# Patient Record
Sex: Male | Born: 1980 | Race: White | Hispanic: No | Marital: Married | Smoking: Current every day smoker
Health system: Southern US, Community
[De-identification: ages and names within clinical notes are randomized; demographics above are authoritative.]

## PROBLEM LIST (undated history)

## (undated) HISTORY — PX: HAND SURGERY: SHX662

---

## 2002-01-06 ENCOUNTER — Emergency Department (HOSPITAL_COMMUNITY): Admission: EM | Admit: 2002-01-06 | Discharge: 2002-01-06 | Payer: Self-pay | Admitting: Emergency Medicine

## 2002-03-02 ENCOUNTER — Ambulatory Visit (HOSPITAL_COMMUNITY): Admission: RE | Admit: 2002-03-02 | Discharge: 2002-03-02 | Payer: Self-pay | Admitting: *Deleted

## 2002-03-02 ENCOUNTER — Encounter: Payer: Self-pay | Admitting: *Deleted

## 2002-04-25 ENCOUNTER — Emergency Department (HOSPITAL_COMMUNITY): Admission: EM | Admit: 2002-04-25 | Discharge: 2002-04-25 | Payer: Self-pay | Admitting: *Deleted

## 2002-07-10 ENCOUNTER — Emergency Department (HOSPITAL_COMMUNITY): Admission: AC | Admit: 2002-07-10 | Discharge: 2002-07-11 | Payer: Self-pay

## 2002-07-10 ENCOUNTER — Encounter: Payer: Self-pay | Admitting: Emergency Medicine

## 2003-06-28 ENCOUNTER — Ambulatory Visit (HOSPITAL_COMMUNITY): Admission: RE | Admit: 2003-06-28 | Discharge: 2003-06-28 | Payer: Self-pay | Admitting: Family Medicine

## 2003-07-18 ENCOUNTER — Emergency Department (HOSPITAL_COMMUNITY): Admission: EM | Admit: 2003-07-18 | Discharge: 2003-07-18 | Payer: Self-pay | Admitting: Emergency Medicine

## 2004-07-11 ENCOUNTER — Emergency Department (HOSPITAL_COMMUNITY): Admission: EM | Admit: 2004-07-11 | Discharge: 2004-07-12 | Payer: Self-pay | Admitting: Emergency Medicine

## 2004-11-08 ENCOUNTER — Emergency Department (HOSPITAL_COMMUNITY): Admission: EM | Admit: 2004-11-08 | Discharge: 2004-11-08 | Payer: Self-pay | Admitting: Emergency Medicine

## 2005-12-27 ENCOUNTER — Emergency Department (HOSPITAL_COMMUNITY): Admission: EM | Admit: 2005-12-27 | Discharge: 2005-12-27 | Payer: Self-pay | Admitting: Emergency Medicine

## 2006-01-23 ENCOUNTER — Emergency Department (HOSPITAL_COMMUNITY): Admission: EM | Admit: 2006-01-23 | Discharge: 2006-01-23 | Payer: Self-pay | Admitting: Emergency Medicine

## 2006-05-14 ENCOUNTER — Emergency Department (HOSPITAL_COMMUNITY): Admission: EM | Admit: 2006-05-14 | Discharge: 2006-05-14 | Payer: Self-pay | Admitting: Emergency Medicine

## 2006-05-15 ENCOUNTER — Emergency Department (HOSPITAL_COMMUNITY): Admission: EM | Admit: 2006-05-15 | Discharge: 2006-05-15 | Payer: Self-pay | Admitting: Emergency Medicine

## 2006-08-05 ENCOUNTER — Emergency Department (HOSPITAL_COMMUNITY): Admission: EM | Admit: 2006-08-05 | Discharge: 2006-08-05 | Payer: Self-pay | Admitting: Emergency Medicine

## 2006-08-26 ENCOUNTER — Emergency Department (HOSPITAL_COMMUNITY): Admission: EM | Admit: 2006-08-26 | Discharge: 2006-08-26 | Payer: Self-pay | Admitting: Emergency Medicine

## 2007-04-13 ENCOUNTER — Emergency Department (HOSPITAL_COMMUNITY): Admission: EM | Admit: 2007-04-13 | Discharge: 2007-04-13 | Payer: Self-pay | Admitting: Emergency Medicine

## 2008-08-24 ENCOUNTER — Emergency Department (HOSPITAL_COMMUNITY): Admission: EM | Admit: 2008-08-24 | Discharge: 2008-08-24 | Payer: Self-pay | Admitting: Emergency Medicine

## 2008-09-16 ENCOUNTER — Emergency Department (HOSPITAL_COMMUNITY): Admission: EM | Admit: 2008-09-16 | Discharge: 2008-09-16 | Payer: Self-pay | Admitting: Emergency Medicine

## 2010-04-20 LAB — COMPREHENSIVE METABOLIC PANEL
ALT: 42 U/L (ref 0–53)
AST: 32 U/L (ref 0–37)
Albumin: 4 g/dL (ref 3.5–5.2)
Alkaline Phosphatase: 51 U/L (ref 39–117)
BUN: 7 mg/dL (ref 6–23)
CO2: 28 mEq/L (ref 19–32)
Calcium: 8.8 mg/dL (ref 8.4–10.5)
Chloride: 101 mEq/L (ref 96–112)
Creatinine, Ser: 0.94 mg/dL (ref 0.4–1.5)
GFR calc Af Amer: >60 mL/min (ref >60)
GFR calc non Af Amer: >60 mL/min (ref >60)
Glucose, Bld: 90 mg/dL (ref 70–99)
Potassium: 3.8 mEq/L (ref 3.5–5.1)
Sodium: 136 mEq/L (ref 135–145)
Total Bilirubin: 1 mg/dL (ref 0.3–1.2)
Total Protein: 7.3 g/dL (ref 6.0–8.3)

## 2010-04-20 LAB — DIFFERENTIAL
Basophils Absolute: 0 10*3/uL (ref 0.0–0.1)
Basophils Relative: 0 % (ref 0–1)
Eosinophils Absolute: 0.1 10*3/uL (ref 0.0–0.7)
Eosinophils Relative: 1 % (ref 0–5)
Lymphocytes Relative: 18 % (ref 12–46)
Lymphs Abs: 1.3 10*3/uL (ref 0.7–4.0)
Monocytes Absolute: 0.4 10*3/uL (ref 0.1–1.0)
Monocytes Relative: 6 % (ref 3–12)
Neutro Abs: 5.3 10*3/uL (ref 1.7–7.7)
Neutrophils Relative %: 75 % (ref 43–77)

## 2010-04-20 LAB — CBC
HCT: 43.1 % (ref 39.0–52.0)
Hemoglobin: 15.2 g/dL (ref 13.0–17.0)
MCHC: 35.2 g/dL (ref 30.0–36.0)
MCV: 94.5 fL (ref 78.0–100.0)
Platelets: 189 10*3/uL (ref 150–400)
RBC: 4.56 MIL/uL (ref 4.22–5.81)
RDW: 12.6 % (ref 11.5–15.5)
WBC: 7.1 10*3/uL (ref 4.0–10.5)

## 2010-06-01 NOTE — Procedures (Signed)
   Andrew Mueller, Andrew Mueller                           ACCOUNT NO.:  [id number]   MEDICAL RECORD NO.:  [id number]                   PATIENT TYPE:  EMS   LOCATION:  ED                                   FACILITY:  [facility]   PHYSICIAN:  Edward L. Juanetta Gosling, M.D.             DATE OF BIRTH:  02/24/1980   DATE OF PROCEDURE:  04/25/2002  DATE OF DISCHARGE:  04/25/2002                                EKG INTERPRETATION   DATE AND TIME OF TEST:  April 25, 2002 at 0044.   FINDINGS:  The rhythm is sinus rhythm with a tachycardic rate in the 100s.  This is some evidence of left ventricular hypertrophy.  Small Q waves are  seen inferiorly.  There are ST-T wave changes which are diffuse and  nonspecific.   IMPRESSION:  Abnormal electrocardiogram.                                               Edward L. Juanetta Gosling, M.D.    ELH/MEDQ  D:  04/26/2002  T:  04/26/2002  Job:  [id number]

## 2010-10-25 ENCOUNTER — Emergency Department (HOSPITAL_COMMUNITY): Payer: Medicaid Other

## 2010-10-25 ENCOUNTER — Emergency Department (HOSPITAL_COMMUNITY)
Admission: EM | Admit: 2010-10-25 | Discharge: 2010-10-25 | Disposition: A | Discharge status: Home / Self Care | Payer: Medicaid Other | Attending: Emergency Medicine | Admitting: Emergency Medicine

## 2010-10-25 ENCOUNTER — Encounter: Payer: Self-pay | Admitting: Emergency Medicine

## 2010-10-25 DIAGNOSIS — X58XXXA Exposure to other specified factors, initial encounter: Secondary | ICD-10-CM | POA: Insufficient documentation

## 2010-10-25 DIAGNOSIS — S239XXA Sprain of unspecified parts of thorax, initial encounter: Secondary | ICD-10-CM | POA: Insufficient documentation

## 2010-10-25 DIAGNOSIS — IMO0002 Reserved for concepts with insufficient information to code with codable children: Secondary | ICD-10-CM

## 2010-10-25 MED ORDER — PREDNISOLONE 5 MG PO TABS: 60.0000 mg | ORAL_TABLET | Freq: Once | ORAL | Status: DC

## 2010-10-25 MED ORDER — PREDNISONE 20 MG PO TABS
60.0000 mg | ORAL_TABLET | Freq: Once | ORAL | Status: AC
  Administered 2010-10-25: 60 mg via ORAL
  Filled 2010-10-25: qty 3

## 2010-10-25 MED ORDER — PREDNISONE 10 MG PO TABS: 20.0000 mg | ORAL_TABLET | Freq: Every day | ORAL | Status: AC

## 2010-10-25 MED ORDER — OXYCODONE-ACETAMINOPHEN 5-325 MG PO TABS
2.0000 | ORAL_TABLET | Freq: Once | ORAL | Status: AC
  Administered 2010-10-25: 2 via ORAL
  Filled 2010-10-25: qty 2

## 2010-10-25 MED ORDER — DIAZEPAM 5 MG/ML IJ SOLN
5.0000 mg | Freq: Once | INTRAMUSCULAR | Status: AC
  Administered 2010-10-25: 5 mg via INTRAVENOUS
  Filled 2010-10-25: qty 2

## 2010-10-25 MED ORDER — OXYCODONE-ACETAMINOPHEN 7.5-325 MG PO TABS: 1.0000 | ORAL_TABLET | ORAL | Status: AC | PRN

## 2010-10-25 MED ORDER — DIAZEPAM 5 MG PO TABS: 5.0000 mg | ORAL_TABLET | Freq: Two times a day (BID) | ORAL | Status: AC

## 2010-10-25 NOTE — ED Provider Notes (Signed)
History     CSN: [id number] Arrival date & time: 10/25/2010 11:47 AM  Chief Complaint  Patient presents with  . Back Pain    [name] is a 30 y.o. male who presents to the ED for back pain that started suddenly yesterday when he tried to stop a log from rolling off a truck. He was helping someone clear off land and the truck was loaded with logs. The pain radiates from mid back to left leg. The pain is severe. The patient took tylenol extra strength last night without relief. He has difficulty walking due to the pain. He denies any loss of bladder or bowel control.   History reviewed. No pertinent past medical history.  Past Surgical History  Procedure Date  . Hand surgery     No family history on file.  History  Substance Use Topics  . Smoking status: Not on file  . Smokeless tobacco: Not on file  . Alcohol Use: No      Review of Systems  Constitutional: Positive for activity change. Negative for fever and fatigue.  HENT: Negative.   Eyes: Negative.   Respiratory: Negative.   Cardiovascular: Negative.   Gastrointestinal: Negative for nausea, vomiting, abdominal pain, diarrhea and constipation.  Genitourinary: Negative for dysuria, urgency and difficulty urinating.  Musculoskeletal: Positive for back pain and gait problem.  Neurological: Positive for numbness. Negative for syncope and headaches.  Psychiatric/Behavioral: Negative for behavioral problems and confusion.    Allergies  Review of patient's allergies indicates no known allergies.  Home Medications  No current outpatient prescriptions on file.  BP 143/103  Pulse 89  Temp(Src) 98.7 F (37.1 C) (Oral)  Resp 14  Ht 6\' 3"  (1.905 m)  Wt 245 lb (111.131 kg)  BMI 30.62 kg/m2  SpO2 97%  Physical Exam  Nursing note and vitals reviewed. Constitutional: He is oriented to person, place, and time. He appears well-developed and well-nourished. No distress.       Uncomfortable appearing.  HENT:    Head: Normocephalic and atraumatic.  Eyes: EOM are normal. Pupils are equal, round, and reactive to light.  Neck: Normal range of motion. Neck supple.  Cardiovascular: Normal rate and regular rhythm.   Pulmonary/Chest: Effort normal.  Abdominal: Soft. There is no tenderness.  Musculoskeletal: Normal range of motion. He exhibits no edema.       Pedal pulses present and lower extremities without neurovascular compromise.   Neurological: He is alert and oriented to person, place, and time. He has normal reflexes. No cranial nerve deficit.  Skin: Skin is warm and dry.  Psychiatric: He has a normal mood and affect. His behavior is normal. Judgment and thought content normal.   Dg Thoracic Spine W/swimmers  10/25/2010  *RADIOLOGY REPORT*  Clinical Data: Mid back pain  THORACIC SPINE - 2 VIEW + SWIMMERS  Comparison: 09/16/2008  Findings: Vertebral body height is well maintained.  Very minimal osteophytic changes are seen.  The pedicles are within normal limits and no paraspinal mass lesion is noted.  The visualized rib cage is unremarkable.  Visualized lung fields are also unremarkable.  Mild degenerative changes noted at C5-6.  IMPRESSION: Mild degenerative change without acute abnormality.  Original Report Authenticated By: Phillips Odor, M.D.   Assessment: Severe back strain  Plan:  Prednisone 40 mg. Po daily x 5 days   Percocet 5/325 mg. Po q 6 hours   Valium 5 mg.   Follow [facility] with [facility]   ED Course  Procedures  MDM          Kerrie Buffalo, NP 10/25/10 [address], NP 10/26/10 [phone]

## 2010-10-25 NOTE — ED Notes (Signed)
Pt tried to stop a log from rolling off truck. Strained mid to lower back. Pt states pain going down left leg as well.

## 2010-10-25 NOTE — ED Notes (Signed)
Pt states attempted to stop rolling log (16" diameter x 18 ft long) from falling off truck. Pt reports feeling pain in back immediately.  Pt states has a lower herniated disc x 3 yrs ago, states has had no issues with back . Pt states pain radiates to lt leg when standing.

## 2010-11-02 NOTE — ED Provider Notes (Signed)
Evaluation and management procedures were performed by the PA/NP under my supervision/collaboration.    Felisa Bonier, MD 11/02/10 [phone]

## 2011-02-19 ENCOUNTER — Encounter (HOSPITAL_COMMUNITY): Payer: Self-pay | Admitting: *Deleted

## 2011-02-19 ENCOUNTER — Emergency Department (HOSPITAL_COMMUNITY)
Admission: EM | Admit: 2011-02-19 | Discharge: 2011-02-19 | Disposition: A | Discharge status: Home / Self Care | Payer: Self-pay | Attending: Emergency Medicine | Admitting: Emergency Medicine

## 2011-02-19 DIAGNOSIS — F17200 Nicotine dependence, unspecified, uncomplicated: Secondary | ICD-10-CM | POA: Insufficient documentation

## 2011-02-19 DIAGNOSIS — K029 Dental caries, unspecified: Secondary | ICD-10-CM | POA: Insufficient documentation

## 2011-02-19 DIAGNOSIS — R509 Fever, unspecified: Secondary | ICD-10-CM | POA: Insufficient documentation

## 2011-02-19 DIAGNOSIS — K089 Disorder of teeth and supporting structures, unspecified: Secondary | ICD-10-CM | POA: Insufficient documentation

## 2011-02-19 DIAGNOSIS — K0889 Other specified disorders of teeth and supporting structures: Secondary | ICD-10-CM

## 2011-02-19 MED ORDER — HYDROCODONE-ACETAMINOPHEN 5-325 MG PO TABS: ORAL_TABLET | ORAL | Status: DC

## 2011-02-19 MED ORDER — IBUPROFEN 800 MG PO TABS
800.0000 mg | ORAL_TABLET | Freq: Once | ORAL | Status: AC
  Administered 2011-02-19: 800 mg via ORAL
  Filled 2011-02-19: qty 1

## 2011-02-19 MED ORDER — PENICILLIN V POTASSIUM 500 MG PO TABS: 500.0000 mg | ORAL_TABLET | Freq: Four times a day (QID) | ORAL | Status: AC

## 2011-02-19 MED ORDER — HYDROCODONE-ACETAMINOPHEN 5-325 MG PO TABS
1.0000 | ORAL_TABLET | Freq: Once | ORAL | Status: AC
  Administered 2011-02-19: 1 via ORAL
  Filled 2011-02-19: qty 1

## 2011-02-19 MED ORDER — PENICILLIN V POTASSIUM 250 MG PO TABS
500.0000 mg | ORAL_TABLET | Freq: Once | ORAL | Status: AC
  Administered 2011-02-19: 500 mg via ORAL
  Filled 2011-02-19: qty 2

## 2011-02-19 NOTE — ED Notes (Signed)
Rick PA in prior to RN, see PA assessment for further 

## 2011-02-19 NOTE — ED Provider Notes (Signed)
History     CSN: [id number]  Arrival date & time 02/19/11  2001   First MD Initiated Contact with Patient 02/19/11 2031      Chief Complaint  Patient presents with  . Dental Pain    (Consider location/radiation/quality/duration/timing/severity/associated sxs/prior treatment) Patient is a 31 y.o. male presenting with tooth pain. The history is provided by the patient.  Dental PainThe primary symptoms include mouth pain and fever. Primary symptoms do not include dental injury. The symptoms began 2 days ago. The symptoms are unchanged. The symptoms occur constantly.    History reviewed. No pertinent past medical history.  Past Surgical History  Procedure Date  . Hand surgery     No family history on file.  History  Substance Use Topics  . Smoking status: Current Everyday Smoker -- 1.0 packs/day  . Smokeless tobacco: Not on file  . Alcohol Use: No      Review of Systems  Constitutional: Positive for fever.  HENT: Positive for dental problem.   All other systems reviewed and are negative.    Allergies  Review of patient's allergies indicates no known allergies.  Home Medications  No current outpatient prescriptions on file.  BP 127/85  Pulse 94  Temp(Src) 99.4 F (37.4 C) (Oral)  Resp 20  Ht 6\' 3"  (1.905 m)  Wt 245 lb (111.131 kg)  BMI 30.62 kg/m2  SpO2 98%  Physical Exam  Nursing note and vitals reviewed. Constitutional: He is oriented to person, place, and time. He appears well-developed and well-nourished.  HENT:  Head: Normocephalic and atraumatic.  Mouth/Throat: Uvula is midline. Dental caries present. No uvula swelling.         L upper lat incisor and bicuspid decayed to gumline.  Eyes: EOM are normal.  Neck: Normal range of motion.  Cardiovascular: Normal rate, regular rhythm, normal heart sounds and intact distal pulses.   Pulmonary/Chest: Effort normal and breath sounds normal. No respiratory distress.  Abdominal: Soft. He exhibits no  distension. There is no tenderness.  Musculoskeletal: Normal range of motion.  Neurological: He is alert and oriented to person, place, and time.  Skin: Skin is warm and dry.  Psychiatric: He has a normal mood and affect. Judgment normal.    ED Course  Procedures (including critical care time)  Labs Reviewed - No data to display No results found.   No diagnosis found.    MDM          Worthy Rancher, PA 02/19/11 2207

## 2011-02-19 NOTE — ED Provider Notes (Signed)
Medical screening examination/treatment/procedure(s) were performed by non-physician practitioner and as supervising physician I was immediately available for consultation/collaboration.   Maeryn Mcgath L Jonaven Hilgers, MD 02/19/11 2239 

## 2011-02-19 NOTE — ED Notes (Signed)
Toothache since Sunday 

## 2011-04-14 ENCOUNTER — Encounter (HOSPITAL_COMMUNITY): Payer: Self-pay | Admitting: *Deleted

## 2011-04-14 ENCOUNTER — Emergency Department (HOSPITAL_COMMUNITY)
Admission: EM | Admit: 2011-04-14 | Discharge: 2011-04-14 | Disposition: A | Discharge status: Home / Self Care | Payer: Self-pay | Attending: Emergency Medicine | Admitting: Emergency Medicine

## 2011-04-14 DIAGNOSIS — K0889 Other specified disorders of teeth and supporting structures: Secondary | ICD-10-CM

## 2011-04-14 DIAGNOSIS — F17200 Nicotine dependence, unspecified, uncomplicated: Secondary | ICD-10-CM | POA: Insufficient documentation

## 2011-04-14 DIAGNOSIS — K089 Disorder of teeth and supporting structures, unspecified: Secondary | ICD-10-CM | POA: Insufficient documentation

## 2011-04-14 DIAGNOSIS — K029 Dental caries, unspecified: Secondary | ICD-10-CM | POA: Insufficient documentation

## 2011-04-14 MED ORDER — PENICILLIN V POTASSIUM 250 MG PO TABS
ORAL_TABLET | ORAL | Status: AC
  Filled 2011-04-14: qty 2

## 2011-04-14 MED ORDER — OXYCODONE-ACETAMINOPHEN 5-325 MG PO TABS
ORAL_TABLET | ORAL | Status: AC
  Filled 2011-04-14: qty 1

## 2011-04-14 MED ORDER — HYDROCODONE-ACETAMINOPHEN 5-325 MG PO TABS: ORAL_TABLET | ORAL | Status: AC

## 2011-04-14 MED ORDER — OXYCODONE-ACETAMINOPHEN 5-325 MG PO TABS
1.0000 | ORAL_TABLET | Freq: Once | ORAL | Status: AC
  Administered 2011-04-14: 1 via ORAL

## 2011-04-14 MED ORDER — PENICILLIN V POTASSIUM 250 MG PO TABS
500.0000 mg | ORAL_TABLET | Freq: Once | ORAL | Status: AC
  Administered 2011-04-14: 500 mg via ORAL

## 2011-04-14 MED ORDER — PENICILLIN V POTASSIUM 500 MG PO TABS: 500.0000 mg | ORAL_TABLET | Freq: Four times a day (QID) | ORAL | Status: AC

## 2011-04-14 NOTE — ED Notes (Signed)
Pt stated that his wife is out there to drive pt back home

## 2011-04-14 NOTE — ED Notes (Signed)
C/o left lower pain to tooth that has "abscessed" per pt, denies seeing a dentist for it

## 2011-04-14 NOTE — ED Notes (Signed)
Left sided toothache x 4 days.  States had some "left over" PCN and took 4 pills yesterday and 2 pills today and has ran out.

## 2011-04-14 NOTE — ED Provider Notes (Signed)
History     CSN: [id number]  Arrival date & time 04/14/11  1758   First MD Initiated Contact with Patient 04/14/11 1915      Chief Complaint  Patient presents with  . Dental Pain    (Consider location/radiation/quality/duration/timing/severity/associated sxs/prior treatment) Patient is a 31 y.o. male presenting with tooth pain. The history is provided by the patient. No language interpreter was used.  Dental PainThe primary symptoms include mouth pain. Primary symptoms do not include dental injury, oral bleeding, oral lesions, headaches, fever, sore throat or angioedema. The symptoms began 3 to 5 days ago. The symptoms are unchanged. The symptoms are new. The symptoms occur constantly.  Mouth pain began 3 - 5 days ago. Mouth pain occurs constantly. Mouth pain is unchanged. Affected locations include: teeth and gum(s).  Additional symptoms include: dental sensitivity to temperature and gum tenderness. Additional symptoms do not include: gum swelling, purulent gums, trismus, jaw pain, facial swelling, trouble swallowing and drooling. Medical issues include: smoking and periodontal disease.    History reviewed. No pertinent past medical history.  Past Surgical History  Procedure Date  . Hand surgery     History reviewed. No pertinent family history.  History  Substance Use Topics  . Smoking status: Current Everyday Smoker -- 1.0 packs/day  . Smokeless tobacco: Not on file  . Alcohol Use: No      Review of Systems  Constitutional: Negative for fever and chills.  HENT: Positive for dental problem. Negative for sore throat, facial swelling, drooling, trouble swallowing, neck pain and neck stiffness.   Gastrointestinal: Negative for vomiting.  Skin: Negative for rash.  Neurological: Negative for dizziness, weakness, numbness and headaches.  Hematological: Negative for adenopathy. Does not bruise/bleed easily.  All other systems reviewed and are negative.    Allergies    Review of patient's allergies indicates no known allergies.  Home Medications   Current Outpatient Rx  Name Route Sig Dispense Refill  . HYDROCODONE-ACETAMINOPHEN 5-325 MG PO TABS  One tab po q 4-6 hrs prn pain 20 tablet 0    BP 142/87  Pulse 67  Temp(Src) 98.1 F (36.7 C) (Oral)  Resp 20  Ht 6\' 3"  (1.905 m)  Wt 245 lb (111.131 kg)  BMI 30.62 kg/m2  SpO2 100%  Physical Exam  Nursing note and vitals reviewed. Constitutional: He is oriented to person, place, and time. He appears well-developed and well-nourished. No distress.  HENT:  Head: Normocephalic and atraumatic. No trismus in the jaw.  Right Ear: Tympanic membrane and ear canal normal.  Left Ear: Tympanic membrane and ear canal normal.  Mouth/Throat: Uvula is midline, oropharynx is clear and moist and mucous membranes are normal. No oral lesions. Dental caries present. No dental abscesses or uvula swelling.    Neck: Normal range of motion. Neck supple.  Cardiovascular: Normal rate, regular rhythm and normal heart sounds.   Pulmonary/Chest: Effort normal and breath sounds normal. No respiratory distress.  Abdominal: Soft. He exhibits no distension. There is no tenderness.  Lymphadenopathy:    He has no cervical adenopathy.  Neurological: He is alert and oriented to person, place, and time. He exhibits normal muscle tone. Coordination normal.  Skin: Skin is warm and dry.    ED Course  Procedures (including critical care time)       MDM    Patient has multiple dental caries. No obvious dental abscess at this time. No facial edema no trismus. Patient has been seen here in previously for similar symptoms. He does  not have him the finances for dental followup.    Patient / Family / Caregiver understand and agree with initial ED impression and plan with expectations set for ED visit. Pt stable in ED with no significant deterioration in condition. Pt feels improved after observation and/or treatment in ED.       Goro Wenrick L. [place], [place] 04/15/11 2102

## 2011-04-14 NOTE — Discharge Instructions (Signed)
Dental Pain A tooth ache may be caused by cavities (tooth decay). Cavities expose the nerve of the tooth to air and hot or cold temperatures. It may come from an infection or abscess (also called a boil or furuncle) around your tooth. It is also often caused by dental caries (tooth decay). This causes the pain you are having. DIAGNOSIS  Your caregiver can diagnose this problem by exam. TREATMENT   If caused by an infection, it may be treated with medications which kill germs (antibiotics) and pain medications as prescribed by your caregiver. Take medications as directed.   Only take over-the-counter or prescription medicines for pain, discomfort, or fever as directed by your caregiver.   Whether the tooth ache today is caused by infection or dental disease, you should see your dentist as soon as possible for further care.  SEEK MEDICAL CARE IF: The exam and treatment you received today has been provided on an emergency basis only. This is not a substitute for complete medical or dental care. If your problem worsens or new problems (symptoms) appear, and you are unable to meet with your dentist, call or return to this location. SEEK IMMEDIATE MEDICAL CARE IF:   You have a fever.   You develop redness and swelling of your face, jaw, or neck.   You are unable to open your mouth.   You have severe pain uncontrolled by pain medicine.  MAKE SURE YOU:   Understand these instructions.   Will watch your condition.   Will get help right away if you are not doing well or get worse.  Document Released: 12/31/2004 Document Revised: 12/20/2010 Document Reviewed: 08/19/2007 [facility] Patient Information 2012 [place], [place].

## 2011-04-16 NOTE — ED Provider Notes (Signed)
Medical screening examination/treatment/procedure(s) were performed by non-physician practitioner and as supervising physician I was immediately available for consultation/collaboration.  Izola Teague R. Alashia Brownfield, MD 04/16/11 1549 

## 2011-05-15 ENCOUNTER — Emergency Department (HOSPITAL_COMMUNITY)
Admission: EM | Admit: 2011-05-15 | Discharge: 2011-05-15 | Disposition: A | Discharge status: Home / Self Care | Payer: Self-pay | Attending: Emergency Medicine | Admitting: Emergency Medicine

## 2011-05-15 ENCOUNTER — Encounter (HOSPITAL_COMMUNITY): Payer: Self-pay | Admitting: *Deleted

## 2011-05-15 DIAGNOSIS — IMO0002 Reserved for concepts with insufficient information to code with codable children: Secondary | ICD-10-CM | POA: Insufficient documentation

## 2011-05-15 DIAGNOSIS — M545 Low back pain, unspecified: Secondary | ICD-10-CM | POA: Insufficient documentation

## 2011-05-15 DIAGNOSIS — M5416 Radiculopathy, lumbar region: Secondary | ICD-10-CM

## 2011-05-15 DIAGNOSIS — F17200 Nicotine dependence, unspecified, uncomplicated: Secondary | ICD-10-CM | POA: Insufficient documentation

## 2011-05-15 MED ORDER — HYDROMORPHONE HCL PF 1 MG/ML IJ SOLN
1.0000 mg | Freq: Once | INTRAMUSCULAR | Status: AC
  Administered 2011-05-15: 1 mg via INTRAMUSCULAR
  Filled 2011-05-15: qty 1

## 2011-05-15 MED ORDER — OXYCODONE-ACETAMINOPHEN 5-325 MG PO TABS: 1.0000 | ORAL_TABLET | ORAL | Status: AC | PRN

## 2011-05-15 MED ORDER — PREDNISONE 10 MG PO TABS: ORAL_TABLET | ORAL | Status: DC

## 2011-05-15 MED ORDER — CYCLOBENZAPRINE HCL 5 MG PO TABS: 5.0000 mg | ORAL_TABLET | Freq: Three times a day (TID) | ORAL | Status: AC | PRN

## 2011-05-15 MED ORDER — PREDNISONE 20 MG PO TABS
60.0000 mg | ORAL_TABLET | Freq: Once | ORAL | Status: AC
  Administered 2011-05-15: 60 mg via ORAL
  Filled 2011-05-15: qty 3

## 2011-05-15 NOTE — ED Provider Notes (Signed)
History     CSN: [id number]  Arrival date & time 05/15/11  1410   First MD Initiated Contact with Patient 05/15/11 1524      Chief Complaint  Patient presents with  . Back Pain    (Consider location/radiation/quality/duration/timing/severity/associated sxs/prior treatment) HPI Comments: Andrew Mueller presents with acute on chronic low back pain after picking up his dog yesterday evening when he accidentally got out of the house.  He reports bilateral lower back pain with radiation into his left posterior thigh which is not new or different from prior intermittent episodes of low back pain.  He denies weakness or numbness in his lower extremities.  He has had no urinary or bowel incontinence or retention.  Pain is sharp and stabbing, worse with certain positions palpation and movement.  Rest improves the discomfort.  He denies fevers or chills, he has no history of cancer and no IV drug use.  Patient is a 31 y.o. male presenting with back pain. The history is provided by the patient.  Back Pain  Pertinent negatives include no chest pain, no fever, no numbness, no headaches, no abdominal pain and no weakness.    History reviewed. No pertinent past medical history.  Past Surgical History  Procedure Date  . Hand surgery     History reviewed. No pertinent family history.  History  Substance Use Topics  . Smoking status: Current Everyday Smoker -- 1.0 packs/day  . Smokeless tobacco: Not on file  . Alcohol Use: No      Review of Systems  Constitutional: Negative for fever.  HENT: Negative for congestion, sore throat and neck pain.   Eyes: Negative.   Respiratory: Negative for chest tightness and shortness of breath.   Cardiovascular: Negative for chest pain.  Gastrointestinal: Negative for nausea and abdominal pain.  Genitourinary: Negative.   Musculoskeletal: Positive for back pain. Negative for joint swelling and arthralgias.  Skin: Negative.  Negative for rash and wound.    Neurological: Negative for dizziness, weakness, light-headedness, numbness and headaches.  Hematological: Negative.   Psychiatric/Behavioral: Negative.     Allergies  Review of patient's allergies indicates no known allergies.  Home Medications   Current Outpatient Rx  Name Route Sig Dispense Refill  . ACETAMINOPHEN 500 MG PO TABS Oral Take 1,000 mg by mouth as needed. FOR PAIN    . ASPIRIN-ACETAMINOPHEN-CAFFEINE 250-250-65 MG PO TABS Oral Take 2 tablets by mouth once as needed. For pain    . CYCLOBENZAPRINE HCL 5 MG PO TABS Oral Take 1 tablet (5 mg total) by mouth 3 (three) times daily as needed for muscle spasms. 15 tablet 0  . OXYCODONE-ACETAMINOPHEN 5-325 MG PO TABS Oral Take 1 tablet by mouth every 4 (four) hours as needed for pain. 20 tablet 0  . PREDNISONE 10 MG PO TABS  6, 5, 4, 3, 2 then 1 tablet by mouth daily for 6 days total. 21 tablet 0    BP 138/83  Pulse 82  Temp(Src) 98.2 F (36.8 C) (Oral)  Resp 20  Ht 6\' 2"  (1.88 m)  Wt 240 lb (108.863 kg)  BMI 30.81 kg/m2  SpO2 99%  Physical Exam  Nursing note and vitals reviewed. Constitutional: He appears well-developed and well-nourished.  HENT:  Head: Normocephalic.  Eyes: Conjunctivae are normal.  Neck: Normal range of motion. Neck supple.  Cardiovascular: Normal rate and intact distal pulses.        Pedal pulses normal.  Pulmonary/Chest: Effort normal.  Abdominal: Soft. Bowel sounds are normal.  He exhibits no distension and no mass.  Musculoskeletal: Normal range of motion. He exhibits no edema.       Lumbar back: He exhibits tenderness. He exhibits no swelling, no edema and no spasm.  Neurological: He is alert. He has normal strength. He displays no atrophy and no tremor. No sensory deficit. Gait normal.  Reflex Scores:      Patellar reflexes are 2+ on the right side and 2+ on the left side.      Achilles reflexes are 2+ on the right side and 2+ on the left side.      No strength deficit noted in hip and knee  flexor and extensor muscle groups.  Ankle flexion and extension intact.  Skin: Skin is warm and dry.  Psychiatric: He has a normal mood and affect.    ED Course  Procedures (including critical care time)  Labs Reviewed - No data to display No results found.   1. Lumbar radiculopathy    Patient was given Dilaudid 1 mg IM injection and prednisone 60 mg by mouth.   MDM  Acute on chronic lumbar radiculopathy with no emergent signs or symptoms by history or exam today.  Patient was prescribed a six-day prednisone taper to start tomorrow, Flexeril, oxycodone.  Encouraged rest and recheck if not improving over the next 5 days.  He was given resource guide for locating primary medical provider.        Burgess Amor, [place] 05/15/11 [phone]

## 2011-05-15 NOTE — ED Notes (Signed)
Picked up large dog last night, and injured his back

## 2011-05-15 NOTE — Discharge Instructions (Signed)
Lumbosacral Radiculopathy Lumbosacral radiculopathy is a pinched nerve or nerves in the low back (lumbosacral area). When this happens you may have weakness in your legs and may not be able to stand on your toes. You may have pain going down into your legs. There may be difficulties with walking normally. There are many causes of this problem. Sometimes this may happen from an injury, or simply from arthritis or boney problems. It may also be caused by other illnesses such as diabetes. If there is no improvement after treatment, further studies may be done to find the exact cause. DIAGNOSIS  X-rays may be needed if the problems become long standing. Electromyograms may be done. This study is one in which the working of nerves and muscles is studied. HOME CARE INSTRUCTIONS   Applications of ice packs may be helpful. Ice can be used in a plastic bag with a towel around it to prevent frostbite to skin. This may be used every 2 hours for 20 to 30 minutes, or as needed, while awake, or as directed by your caregiver.   Only take over-the-counter or prescription medicines for pain, discomfort, or fever as directed by your caregiver.   If physical therapy was prescribed, follow your caregiver's directions.  SEEK IMMEDIATE MEDICAL CARE IF:   You have pain not controlled with medications.   You seem to be getting worse rather than better.   You develop increasing weakness in your legs.   You develop loss of bowel or bladder control.   You have difficulty with walking or balance, or develop clumsiness in the use of your legs.   You have a fever.  MAKE SURE YOU:   Understand these instructions.   Will watch your condition.   Will get help right away if you are not doing well or get worse.  Document Released: 12/31/2004 Document Revised: 12/20/2010 Document Reviewed: 08/21/2007 [facility] Patient Information 2012 [place], [place].   Take your next dose of prednisone tomorrow morning.  Use the the  other medicines as directed.  Do not drive within 4 hours of taking oxycodone as this will make you drowsy.  Avoid lifting,  Bending,  Twisting or any other activity that worsens your pain over the next week.  Apply an  icepack  to your lower back for 10-15 minutes every 2 hours for the next 2 days.  You should get rechecked if your symptoms are not better over the next 5 days,  Or you develop increased pain,  Weakness in your leg(s) or loss of bladder or bowel function - these are symptoms of a worse injury.     RESOURCE GUIDE  Dental Problems  Patients with Medicaid: [facility] [phone] [address]                                           [phone] [address] Phone:  [phone]                                                  Phone:  [phone]  If unable to pay or uninsured, contact:  [facility]  or [facility]. to become qualified for the adult dental clinic.  Chronic Pain Problems Contact [facility]  [phone] Patients need to be referred by their primary care doctor.  Insufficient Money for Medicine Contact [employer]:  call "211" or [facility] [phone].  No Primary Care Doctor Call [facility]  [phone] Other agencies that provide inexpensive medical care    [facility]  [phone]    [facility]  [phone]    [facility]  [phone]    [facility]  [phone]    [facility]  [phone]    [facility]  [phone]  Psychological Services [facility] Behavioral Health  [phone] [facility]  [phone] [facility]   [phone] (emergency services [phone])  Substance Abuse Resources [facility]  639-615-5883 [facility] 313-225-6774 [facility] [place] [phone] Floydene Flock [phone] Residential & Outpatient Substance Abuse Program   [phone]  Abuse/Neglect [facility] Child Abuse Hotline [phone] [facility] Child Abuse Hotline 321-616-1072 (After Hours)  Emergency Shelter [facility] 734-234-3738  Maternity Homes [facility] [place] [facility] [phone] [facility] [phone]  MRSA Hotline #:   6146834455    [facility] Resources  [facility]     [employer]                          [facility] Dept. [address] [place]                       [address]      [address]  Blondell Reveal Phone:  [phone]                                   Phone:  [phone]                 Phone:  [phone]  [facility] Mental Health Phone:  [phone]  [facility] Child Abuse Hotline 802-104-0882 [phone] (After Hours)

## 2011-05-15 NOTE — ED Provider Notes (Signed)
Medical screening examination/treatment/procedure(s) were performed by non-physician practitioner and as supervising physician I was immediately available for consultation/collaboration.   Benny Lennert, MD 05/15/11 [phone]

## 2011-05-20 ENCOUNTER — Emergency Department (HOSPITAL_COMMUNITY)
Admission: EM | Admit: 2011-05-20 | Discharge: 2011-05-20 | Disposition: A | Discharge status: Home / Self Care | Payer: Self-pay | Attending: Emergency Medicine | Admitting: Emergency Medicine

## 2011-05-20 ENCOUNTER — Encounter (HOSPITAL_COMMUNITY): Payer: Self-pay | Admitting: *Deleted

## 2011-05-20 DIAGNOSIS — M79609 Pain in unspecified limb: Secondary | ICD-10-CM | POA: Insufficient documentation

## 2011-05-20 DIAGNOSIS — M549 Dorsalgia, unspecified: Secondary | ICD-10-CM

## 2011-05-20 DIAGNOSIS — M6281 Muscle weakness (generalized): Secondary | ICD-10-CM | POA: Insufficient documentation

## 2011-05-20 DIAGNOSIS — M545 Low back pain, unspecified: Secondary | ICD-10-CM | POA: Insufficient documentation

## 2011-05-20 DIAGNOSIS — Z79899 Other long term (current) drug therapy: Secondary | ICD-10-CM | POA: Insufficient documentation

## 2011-05-20 DIAGNOSIS — F17200 Nicotine dependence, unspecified, uncomplicated: Secondary | ICD-10-CM | POA: Insufficient documentation

## 2011-05-20 MED ORDER — OXYCODONE-ACETAMINOPHEN 5-325 MG PO TABS
2.0000 | ORAL_TABLET | Freq: Once | ORAL | Status: AC
  Administered 2011-05-20: 2 via ORAL
  Filled 2011-05-20: qty 2

## 2011-05-20 MED ORDER — KETOROLAC TROMETHAMINE 10 MG PO TABS
10.0000 mg | ORAL_TABLET | Freq: Once | ORAL | Status: AC
  Administered 2011-05-20: 10 mg via ORAL
  Filled 2011-05-20: qty 1

## 2011-05-20 MED ORDER — OXYCODONE-ACETAMINOPHEN 5-325 MG PO TABS
ORAL_TABLET | ORAL | Status: DC
Start: 2011-05-20 — End: 2011-06-19

## 2011-05-20 MED ORDER — DEXAMETHASONE 4 MG PO TABS: ORAL_TABLET | ORAL | Status: AC

## 2011-05-20 MED ORDER — PROMETHAZINE HCL 12.5 MG PO TABS
12.5000 mg | ORAL_TABLET | Freq: Once | ORAL | Status: AC
  Administered 2011-05-20: 12.5 mg via ORAL
  Filled 2011-05-20: qty 1

## 2011-05-20 NOTE — Discharge Instructions (Signed)
I am concerned that your back pain is getting progressively worse while taking appropriate medications, and multiple Emergency Dept evaluationsBack Pain, Adult Back pain is very common. The pain often gets better over time. The cause of back pain is usually not dangerous. Most people can learn to manage their back pain on their own.  HOME CARE   Stay active. Start with short walks on flat ground if you can. Try to walk farther each day.   Do not sit, drive, or stand in one place for more than 30 minutes. Do not stay in bed.   Do not avoid exercise or work. Activity can help your back heal faster.   Be careful when you bend or lift an object. Bend at your knees, keep the object close to you, and do not twist.   Sleep on a firm mattress. Lie on your side, and bend your knees. If you lie on your back, put a pillow under your knees.   Only take medicines as told by your doctor.   Put ice on the injured area.   Put ice in a plastic bag.   Place a towel between your skin and the bag.   Leave the ice on for 15 to 20 minutes, 3 to 4 times a day for the first 2 to 3 days. After that, you can switch between ice and heat packs.   Ask your doctor about back exercises or massage.   Avoid feeling anxious or stressed. Find good ways to deal with stress, such as exercise.  GET HELP RIGHT AWAY IF:   Your pain does not go away with rest or medicine.   Your pain does not go away in 1 week.   You have new problems.   You do not feel well.   The pain spreads into your legs.   You cannot control when you poop (bowel movement) or pee (urinate).   Your arms or legs feel weak or lose feeling (numbness).   You feel sick to your stomach (nauseous) or throw up (vomit).   You have belly (abdominal) pain.   You feel like you may pass out (faint).  MAKE SURE YOU:   Understand these instructions.   Will watch your condition.   Will get help right away if you are not doing well or get worse.    Document Released: 06/19/2007 Document Revised: 12/20/2010 Document Reviewed: 05/21/2010 [facility] Patient Information 2012 [place], [place].. It is extremely important that you call Dr Luiz Blare for evaluation of your back and progression of your back pain.

## 2011-05-20 NOTE — ED Notes (Signed)
Pt DC to home with steady gait 

## 2011-05-20 NOTE — ED Provider Notes (Signed)
Medical screening examination/treatment/procedure(s) were performed by non-physician practitioner and as supervising physician I was immediately available for consultation/collaboration.   Benny Lennert, MD 05/20/11 [phone]

## 2011-05-20 NOTE — ED Provider Notes (Signed)
History     CSN: [id number]  Arrival date & time 05/20/11  1331   First MD Initiated Contact with Patient 05/20/11 1606      Chief Complaint  Patient presents with  . Back Pain    (Consider location/radiation/quality/duration/timing/severity/associated sxs/prior treatment) Patient is a 31 y.o. male presenting with back pain. The history is provided by the patient.  Back Pain  This is a recurrent problem. The current episode started more than 1 week ago. The problem occurs constantly. The problem has been gradually worsening. The pain is associated with lifting heavy objects and twisting. The pain is present in the lumbar spine. The quality of the pain is described as shooting and aching. The pain radiates to the left thigh. The pain is severe. The symptoms are aggravated by certain positions. The pain is the same all the time. Stiffness is present all day. Pertinent negatives include no chest pain, no numbness, no abdominal pain, no bowel incontinence, no perianal numbness, no bladder incontinence and no dysuria. He has tried analgesics and muscle relaxants for the symptoms. The treatment provided mild relief. Risk factors: Hx of previous back problems.    History reviewed. No pertinent past medical history.  Past Surgical History  Procedure Date  . Hand surgery     Family History  Problem Relation Age of Onset  . Diabetes Mother   . Diabetes Father     History  Substance Use Topics  . Smoking status: Current Everyday Smoker -- 1.0 packs/day  . Smokeless tobacco: Not on file  . Alcohol Use: No      Review of Systems  Constitutional: Negative for activity change.       All ROS Neg except as noted in HPI  HENT: Negative for nosebleeds and neck pain.   Eyes: Negative for photophobia and discharge.  Respiratory: Negative for cough, shortness of breath and wheezing.   Cardiovascular: Negative for chest pain and palpitations.  Gastrointestinal: Negative for abdominal pain,  blood in stool and bowel incontinence.  Genitourinary: Negative for bladder incontinence, dysuria, frequency and hematuria.  Musculoskeletal: Positive for back pain. Negative for arthralgias.  Skin: Negative.   Neurological: Negative for dizziness, seizures, speech difficulty and numbness.  Psychiatric/Behavioral: Negative for hallucinations and confusion.    Allergies  Review of patient's allergies indicates no known allergies.  Home Medications   Current Outpatient Rx  Name Route Sig Dispense Refill  . ACETAMINOPHEN 500 MG PO TABS Oral Take 1,000 mg by mouth as needed. FOR PAIN    . ASPIRIN-ACETAMINOPHEN-CAFFEINE 250-250-65 MG PO TABS Oral Take 2 tablets by mouth once as needed. For pain    . CYCLOBENZAPRINE HCL 5 MG PO TABS Oral Take 1 tablet (5 mg total) by mouth 3 (three) times daily as needed for muscle spasms. 15 tablet 0  . DEXAMETHASONE 4 MG PO TABS  2 po day one, then 1 po daily . Take with food 8 tablet 0  . OXYCODONE-ACETAMINOPHEN 5-325 MG PO TABS Oral Take 1 tablet by mouth every 4 (four) hours as needed for pain. 20 tablet 0  . OXYCODONE-ACETAMINOPHEN 5-325 MG PO TABS  1 or 2 po q6h prn pain 20 tablet 0  . PREDNISONE 10 MG PO TABS  6, 5, 4, 3, 2 then 1 tablet by mouth daily for 6 days total. 21 tablet 0    BP 136/89  Pulse 89  Temp(Src) 98 F (36.7 C) (Oral)  Resp 20  Ht 6\' 2"  (1.88 m)  Wt 240 lb (  108.863 kg)  BMI 30.81 kg/m2  SpO2 99%  Physical Exam  Nursing note and vitals reviewed. Constitutional: He is oriented to person, place, and time. He appears well-developed and well-nourished.  Non-toxic appearance.  HENT:  Head: Normocephalic.  Right Ear: Tympanic membrane and external ear normal.  Left Ear: Tympanic membrane and external ear normal.  Eyes: EOM and lids are normal. Pupils are equal, round, and reactive to light.  Neck: Normal range of motion. Neck supple. Carotid bruit is not present.  Cardiovascular: Normal rate, regular rhythm, normal heart  sounds, intact distal pulses and normal pulses.   Pulmonary/Chest: Breath sounds normal. No respiratory distress.  Abdominal: Soft. Bowel sounds are normal. There is no tenderness. There is no guarding.  Musculoskeletal: Normal range of motion.       Pain to the lumbar area to palpation and attempted ROM.  Lymphadenopathy:       Head (right side): No submandibular adenopathy present.       Head (left side): No submandibular adenopathy present.    He has no cervical adenopathy.  Neurological: He is alert and oriented to person, place, and time. He has normal strength. No cranial nerve deficit or sensory deficit.       Weakness to dorsiflexion and plantar flexion on the left. Sensory wnl.  Skin: Skin is warm and dry.  Psychiatric: He has a normal mood and affect. His speech is normal.    ED Course  Procedures (including critical care time)  Labs Reviewed - No data to display No results found.   1. Back pain       MDM  I have reviewed nursing notes, vital signs, and all appropriate lab and imaging results for this patient. Ptlifted a heavy dog on 4/30. He was evaluated in ED on 5/1, treated with narcotic pain meds, muscle relaxant, and steroid. Pt states the pain is getting worse. He is not sleeping well due to pain, and it is affecting his sciatic nerve similar to a previous back problem. Advised pt of concern of progression of pain and symptom in spite of medications and rest. Strongly suggested pt see orthopedic MD ASAP. Resource given.Rx for Percocet #20 and Decadron daily with food given.      Kathie Dike, [place] 05/20/11 [phone]

## 2011-05-20 NOTE — ED Notes (Signed)
Low back pain with radiation down lt leg.  Seen here for same

## 2011-06-19 ENCOUNTER — Encounter (HOSPITAL_COMMUNITY): Payer: Self-pay | Admitting: Oncology

## 2011-06-19 ENCOUNTER — Emergency Department (HOSPITAL_COMMUNITY)
Admission: EM | Admit: 2011-06-19 | Discharge: 2011-06-19 | Disposition: A | Discharge status: Home / Self Care | Payer: Self-pay | Attending: Emergency Medicine | Admitting: Emergency Medicine

## 2011-06-19 DIAGNOSIS — M545 Low back pain, unspecified: Secondary | ICD-10-CM

## 2011-06-19 DIAGNOSIS — IMO0002 Reserved for concepts with insufficient information to code with codable children: Secondary | ICD-10-CM | POA: Insufficient documentation

## 2011-06-19 DIAGNOSIS — M5416 Radiculopathy, lumbar region: Secondary | ICD-10-CM

## 2011-06-19 DIAGNOSIS — R52 Pain, unspecified: Secondary | ICD-10-CM | POA: Insufficient documentation

## 2011-06-19 DIAGNOSIS — F17200 Nicotine dependence, unspecified, uncomplicated: Secondary | ICD-10-CM | POA: Insufficient documentation

## 2011-06-19 DIAGNOSIS — G8929 Other chronic pain: Secondary | ICD-10-CM | POA: Insufficient documentation

## 2011-06-19 MED ORDER — OXYCODONE-ACETAMINOPHEN 5-325 MG PO TABS
1.0000 | ORAL_TABLET | Freq: Once | ORAL | Status: AC
  Administered 2011-06-19: 1 via ORAL
  Filled 2011-06-19: qty 1

## 2011-06-19 MED ORDER — PREDNISONE 50 MG PO TABS: 50.0000 mg | ORAL_TABLET | Freq: Every day | ORAL | Status: AC

## 2011-06-19 MED ORDER — OXYCODONE-ACETAMINOPHEN 5-325 MG PO TABS: ORAL_TABLET | ORAL | Status: DC

## 2011-06-19 MED ORDER — PREDNISONE 20 MG PO TABS
60.0000 mg | ORAL_TABLET | Freq: Once | ORAL | Status: AC
  Administered 2011-06-19: 60 mg via ORAL
  Filled 2011-06-19: qty 3

## 2011-06-19 NOTE — ED Notes (Signed)
Andrew Mueller reports h/o low back pain, worsening x 3 days ago.  Taking motrin w/o relief of pain.

## 2011-06-19 NOTE — ED Provider Notes (Signed)
History     CSN: [id number]  Arrival date & time 06/19/11  4098   First MD Initiated Contact with Patient 06/19/11 [phone]      Chief Complaint  Patient presents with  . Back Pain    (Consider location/radiation/quality/duration/timing/severity/associated sxs/prior treatment) Patient is a 31 y.o. male presenting with back pain. The history is provided by the patient. No language interpreter was used.  Back Pain  This is a chronic problem. Episode onset: 3 days ago. The problem occurs constantly. The problem has not changed since onset.Associated with: putting a water pump in his car 3 days ago and "bending over all day". The pain is present in the lumbar spine. The quality of the pain is described as aching. The pain radiates to the left thigh. The pain is severe. The symptoms are aggravated by bending. The pain is the same all the time. Pertinent negatives include no numbness, no bowel incontinence, no perianal numbness, no bladder incontinence, no paresthesias, no paresis, no tingling and no weakness. He has tried NSAIDs and heat for the symptoms. The treatment provided no relief.    History reviewed. No pertinent past medical history.  Past Surgical History  Procedure Date  . Hand surgery     Family History  Problem Relation Age of Onset  . Diabetes Mother   . Diabetes Father     History  Substance Use Topics  . Smoking status: Current Everyday Smoker -- 1.0 packs/day  . Smokeless tobacco: Not on file  . Alcohol Use: No      Review of Systems  Constitutional: Negative for chills.  Gastrointestinal: Negative for bowel incontinence.  Genitourinary: Negative for bladder incontinence.  Musculoskeletal: Positive for back pain.  Neurological: Negative for tingling, weakness, numbness and paresthesias.  All other systems reviewed and are negative.    Allergies  Review of patient's allergies indicates no known allergies.  Home Medications   Current Outpatient Rx  Name  Route Sig Dispense Refill  . ACETAMINOPHEN 500 MG PO TABS Oral Take 1,000 mg by mouth as needed. FOR PAIN    . OXYCODONE-ACETAMINOPHEN 5-325 MG PO TABS  One tab po q 4-6 hrs prn painMay take 2 tablets PO q 6 hours for severe pain - Do not take with Tylenol as this tablet already contains tylenol 20 tablet 0  . PREDNISONE 50 MG PO TABS Oral Take 1 tablet (50 mg total) by mouth daily. 6 tablet 0    BP 154/99  Pulse 89  Temp 98 F (36.7 C)  Resp 18  Ht 6\' 3"  (1.905 m)  Wt 240 lb (108.863 kg)  BMI 30.00 kg/m2  SpO2 100%  Physical Exam  Nursing note and vitals reviewed. Constitutional: He is oriented to person, place, and time. He appears well-developed and well-nourished.  HENT:  Head: Normocephalic and atraumatic.  Eyes: EOM are normal.  Neck: Normal range of motion.  Cardiovascular: Normal rate, regular rhythm, normal heart sounds and intact distal pulses.   Pulmonary/Chest: Effort normal and breath sounds normal. No respiratory distress.  Abdominal: Soft. He exhibits no distension. There is no tenderness.  Musculoskeletal: Normal range of motion.       Back:  Neurological: He is alert and oriented to person, place, and time. He has normal strength. He displays normal reflexes. No cranial nerve deficit or sensory deficit. Coordination normal. GCS eye subscore is 4. GCS verbal subscore is 5. GCS motor subscore is 6.  Reflex Scores:      Patellar reflexes are 2+  on the right side and 2+ on the left side.      Achilles reflexes are 2+ on the right side and 2+ on the left side. Skin: Skin is warm and dry.  Psychiatric: He has a normal mood and affect. Judgment normal.    ED Course  Procedures (including critical care time)  Labs Reviewed - No data to display No results found.   1. Lumbar back pain   2. Lumbar radiculopathy   3. Acute exacerbation of chronic low back pain       MDM  Chronic LS back pain with exacerbation. rx prednisone 50 mg, QD, 6 rx percocet 5/325,  20 Ice F/u with dr. Channing Mutters as planned.        Worthy Rancher, PA 06/19/11 1000

## 2011-06-19 NOTE — Discharge Instructions (Signed)
Back Pain, Adult Low back pain is very common. About 1 in 5 people have back pain.The cause of low back pain is rarely dangerous. The pain often gets better over time.About half of people with a sudden onset of back pain feel better in just 2 weeks. About 8 in 10 people feel better by 6 weeks.  CAUSES Some common causes of back pain include:  Strain of the muscles or ligaments supporting the spine.   Wear and tear (degeneration) of the spinal discs.   Arthritis.   Direct injury to the back.  DIAGNOSIS Most of the time, the direct cause of low back pain is not known.However, back pain can be treated effectively even when the exact cause of the pain is unknown.Answering your caregiver's questions about your overall health and symptoms is one of the most accurate ways to make sure the cause of your pain is not dangerous. If your caregiver needs more information, he or she may order lab work or imaging tests (X-rays or MRIs).However, even if imaging tests show changes in your back, this usually does not require surgery. HOME CARE INSTRUCTIONS For many people, back pain returns.Since low back pain is rarely dangerous, it is often a condition that people can learn to manageon their own.   Remain active. It is stressful on the back to sit or stand in one place. Do not sit, drive, or stand in one place for more than 30 minutes at a time. Take short walks on level surfaces as soon as pain allows.Try to increase the length of time you walk each day.   Do not stay in bed.Resting more than 1 or 2 days can delay your recovery.   Do not avoid exercise or work.Your body is made to move.It is not dangerous to be active, even though your back may hurt.Your back will likely heal faster if you return to being active before your pain is gone.   Pay attention to your body when you bend and lift. Many people have less discomfortwhen lifting if they bend their knees, keep the load close to their  bodies,and avoid twisting. Often, the most comfortable positions are those that put less stress on your recovering back.   Find a comfortable position to sleep. Use a firm mattress and lie on your side with your knees slightly bent. If you lie on your back, put a pillow under your knees.   Only take over-the-counter or prescription medicines as directed by your caregiver. Over-the-counter medicines to reduce pain and inflammation are often the most helpful.Your caregiver may prescribe muscle relaxant drugs.These medicines help dull your pain so you can more quickly return to your normal activities and healthy exercise.   Put ice on the injured area.   Put ice in a plastic bag.   Place a towel between your skin and the bag.   Leave the ice on for 15 to 20 minutes, 3 to 4 times a day for the first 2 to 3 days. After that, ice and heat may be alternated to reduce pain and spasms.   Ask your caregiver about trying back exercises and gentle massage. This may be of some benefit.   Avoid feeling anxious or stressed.Stress increases muscle tension and can worsen back pain.It is important to recognize when you are anxious or stressed and learn ways to manage it.Exercise is a great option.  SEEK MEDICAL CARE IF:  You have pain that is not relieved with rest or medicine.   You have   pain that does not improve in 1 week.   You have new symptoms.   You are generally not feeling well.  SEEK IMMEDIATE MEDICAL CARE IF:   You have pain that radiates from your back into your legs.   You develop new bowel or bladder control problems.   You have unusual weakness or numbness in your arms or legs.   You develop nausea or vomiting.   You develop abdominal pain.   You feel faint.  Document Released: 12/31/2004 Document Revised: 12/20/2010 Document Reviewed: 05/21/2010 [facility] Patient Information 2012 [place], [place].Cryotherapy Cryotherapy means treatment with cold. Ice or gel packs can be  used to reduce both pain and swelling. Ice is the most helpful within the first 24 to 48 hours after an injury or flareup from overusing a muscle or joint. Sprains, strains, spasms, burning pain, shooting pain, and aches can all be eased with ice. Ice can also be used when recovering from surgery. Ice is effective, has very few side effects, and is safe for most people to use. PRECAUTIONS  Ice is not a safe treatment option for people with:  Raynaud's phenomenon. This is a condition affecting small blood vessels in the extremities. Exposure to cold may cause your problems to return.   Cold hypersensitivity. There are many forms of cold hypersensitivity, including:   Cold urticaria. Red, itchy hives appear on the skin when the tissues begin to warm after being iced.   Cold erythema. This is a red, itchy rash caused by exposure to cold.   Cold hemoglobinuria. Red blood cells break down when the tissues begin to warm after being iced. The hemoglobin that carry oxygen are passed into the urine because they cannot combine with blood proteins fast enough.   Numbness or altered sensitivity in the area being iced.  If you have any of the following conditions, do not use ice until you have discussed cryotherapy with your caregiver:  Heart conditions, such as arrhythmia, angina, or chronic heart disease.   High blood pressure.   Healing wounds or open skin in the area being iced.   Current infections.   Rheumatoid arthritis.   Poor circulation.   Diabetes.  Ice slows the blood flow in the region it is applied. This is beneficial when trying to stop inflamed tissues from spreading irritating chemicals to surrounding tissues. However, if you expose your skin to cold temperatures for too long or without the proper protection, you can damage your skin or nerves. Watch for signs of skin damage due to cold. HOME CARE INSTRUCTIONS Follow these tips to use ice and cold packs safely.  Place a dry or  damp towel between the ice and skin. A damp towel will cool the skin more quickly, so you may need to shorten the time that the ice is used.   For a more rapid response, add gentle compression to the ice.   Ice for no more than 10 to 20 minutes at a time. The bonier the area you are icing, the less time it will take to get the benefits of ice.   Check your skin after 5 minutes to make sure there are no signs of a poor response to cold or skin damage.   Rest 20 minutes or more in between uses.   Once your skin is numb, you can end your treatment. You can test numbness by very lightly touching your skin. The touch should be so light that you do not see the skin dimple from  the pressure of your fingertip. When using ice, most people will feel these normal sensations in this order: cold, burning, aching, and numbness.   Do not use ice on someone who cannot communicate their responses to pain, such as small children or people with dementia.  HOW TO MAKE AN ICE PACK Ice packs are the most common way to use ice therapy. Other methods include ice massage, ice baths, and cryo-sprays. Muscle creams that cause a cold, tingly feeling do not offer the same benefits that ice offers and should not be used as a substitute unless recommended by your caregiver. To make an ice pack, do one of the following:  Place crushed ice or a bag of frozen vegetables in a sealable plastic bag. Squeeze out the excess air. Place this bag inside another plastic bag. Slide the bag into a pillowcase or place a damp towel between your skin and the bag.   Mix 3 parts water with 1 part rubbing alcohol. Freeze the mixture in a sealable plastic bag. When you remove the mixture from the freezer, it will be slushy. Squeeze out the excess air. Place this bag inside another plastic bag. Slide the bag into a pillowcase or place a damp towel between your skin and the bag.  SEEK MEDICAL CARE IF:  You develop white spots on your skin. This  may give the skin a blotchy (mottled) appearance.   Your skin turns blue or pale.   Your skin becomes waxy or hard.   Your swelling gets worse.  MAKE SURE YOU:   Understand these instructions.   Will watch your condition.   Will get help right away if you are not doing well or get worse.  Document Released: 08/27/2010 Document Revised: 12/20/2010 Document Reviewed: 08/27/2010 [facility] Patient Information 2012 [place], [place].   Take the meds as directed.  Do NOT take any NSAID's while taking the prednisone.  Apply ice several times daily to lower back.  Follow up with dr. Channing Mutters as planned.

## 2011-06-19 NOTE — ED Provider Notes (Signed)
Medical screening examination/treatment/procedure(s) were performed by non-physician practitioner and as supervising physician I was immediately available for consultation/collaboration.  Donnetta Hutching, MD 06/19/11 1549

## 2011-12-31 ENCOUNTER — Emergency Department (HOSPITAL_COMMUNITY)
Admission: EM | Admit: 2011-12-31 | Discharge: 2011-12-31 | Disposition: A | Discharge status: Home / Self Care | Payer: Self-pay | Attending: Emergency Medicine | Admitting: Emergency Medicine

## 2011-12-31 ENCOUNTER — Encounter (HOSPITAL_COMMUNITY): Payer: Self-pay | Admitting: *Deleted

## 2011-12-31 DIAGNOSIS — Y929 Unspecified place or not applicable: Secondary | ICD-10-CM | POA: Insufficient documentation

## 2011-12-31 DIAGNOSIS — Y9389 Activity, other specified: Secondary | ICD-10-CM | POA: Insufficient documentation

## 2011-12-31 DIAGNOSIS — IMO0002 Reserved for concepts with insufficient information to code with codable children: Secondary | ICD-10-CM

## 2011-12-31 DIAGNOSIS — S61209A Unspecified open wound of unspecified finger without damage to nail, initial encounter: Secondary | ICD-10-CM | POA: Insufficient documentation

## 2011-12-31 DIAGNOSIS — F17200 Nicotine dependence, unspecified, uncomplicated: Secondary | ICD-10-CM | POA: Insufficient documentation

## 2011-12-31 DIAGNOSIS — W268XXA Contact with other sharp object(s), not elsewhere classified, initial encounter: Secondary | ICD-10-CM | POA: Insufficient documentation

## 2011-12-31 MED ORDER — IBUPROFEN 600 MG PO TABS: 600.0000 mg | ORAL_TABLET | Freq: Three times a day (TID) | ORAL | Status: DC

## 2011-12-31 MED ORDER — LIDOCAINE HCL (PF) 2 % IJ SOLN
INTRAMUSCULAR | Status: AC
  Filled 2011-12-31: qty 10

## 2011-12-31 NOTE — ED Provider Notes (Signed)
History     CSN: [id number]  Arrival date & time 12/31/11  2027   First MD Initiated Contact with Patient 12/31/11 2118      No chief complaint on file.   (Consider location/radiation/quality/duration/timing/severity/associated sxs/prior treatment) HPI Comments: BURLIN MCNAIR presents with laceration to his left thumb he incurred when attempting to put his car window back in its track.  There was no broken glass,  He lacerated the thumb on the track edge.  The injury occurred just before arrival here.  He applied pressure and obtained hemostasis.  He denies numbness distal to the injury site.  The history is provided by the patient.    History reviewed. No pertinent past medical history.  Past Surgical History  Procedure Date  . Hand surgery     Family History  Problem Relation Age of Onset  . Diabetes Mother   . Diabetes Father     History  Substance Use Topics  . Smoking status: Current Every Day Smoker -- 1.0 packs/day  . Smokeless tobacco: Not on file  . Alcohol Use: No      Review of Systems  Constitutional: Negative for fever and chills.  HENT: Negative for facial swelling.   Respiratory: Negative for shortness of breath and wheezing.   Skin: Positive for wound.  Neurological: Negative for numbness.    Allergies  Review of patient's allergies indicates no known allergies.  Home Medications   Current Outpatient Rx  Name  Route  Sig  Dispense  Refill  . IBUPROFEN 600 MG PO TABS   Oral   Take 1 tablet (600 mg total) by mouth 3 (three) times daily.   21 tablet   0     BP 133/82  Pulse 92  Temp 98.1 F (36.7 C) (Oral)  Resp 18  Ht 6\' 3"  (1.905 m)  Wt 245 lb (111.131 kg)  BMI 30.62 kg/m2  SpO2 98%  Physical Exam  Constitutional: He is oriented to person, place, and time. He appears well-developed and well-nourished.  HENT:  Head: Normocephalic.  Cardiovascular: Normal rate.   Pulmonary/Chest: Effort normal.  Musculoskeletal: Normal range  of motion.  Neurological: He is alert and oriented to person, place, and time. No sensory deficit.  Skin: Laceration noted.       1 cm subc laceration lateral thumb mid proximal phalanx,  Hemostatic.  Distal sensation intact,  ROM normal.    ED Course  Procedures (including critical care time)   LACERATION REPAIR Performed by: Burgess Amor Authorized by: Burgess Amor Consent: Verbal consent obtained. Risks and benefits: risks, benefits and alternatives were discussed Consent given by: patient Patient identity confirmed: provided demographic data Prepped and Draped in normal sterile fashion Wound explored  Laceration Location: thumb  Laceration Length: 1cm  No Foreign Bodies seen or palpated  Anesthesia: local infiltration  Local anesthetic: lidocaine 1 % without epinephrine  Anesthetic total: 2 ml  Irrigation method: syringe Amount of cleaning: standard  Skin closure: ethilon  Number of sutures: 3  Technique: simple interrupted.  Patient tolerance: Patient tolerated the procedure well with no immediate complications.   Labs Reviewed - No data to display No results found.   1. Laceration       MDM  Suture removal in 10 days.        Burgess Amor, [place] 12/31/11 2252

## 2011-12-31 NOTE — ED Notes (Signed)
Pt reports he hut his thumb trying to put a window back in it's track.  Bleeding controlled at present time.  Pt unsure when last tetanus shot was, possibly 2007 or 2008.

## 2011-12-31 NOTE — ED Provider Notes (Signed)
Medical screening examination/treatment/procedure(s) were performed by non-physician practitioner and as supervising physician I was immediately available for consultation/collaboration.   Dione Booze, MD 12/31/11 [phone]

## 2011-12-31 NOTE — ED Notes (Signed)
Patient with no complaints at this time. Respirations even and unlabored. Skin warm/dry. Discharge instructions reviewed with patient at this time. Patient given opportunity to voice concerns/ask questions. Patient discharged at this time and left Emergency Department with steady gait.   

## 2011-12-31 NOTE — ED Notes (Signed)
Laceration to R thumb.  Cleaned w/saf-clens.

## 2012-02-05 ENCOUNTER — Encounter (HOSPITAL_COMMUNITY): Payer: Self-pay | Admitting: *Deleted

## 2012-02-05 ENCOUNTER — Emergency Department (HOSPITAL_COMMUNITY)
Admission: EM | Admit: 2012-02-05 | Discharge: 2012-02-05 | Disposition: A | Discharge status: Home / Self Care | Payer: Self-pay | Attending: Emergency Medicine | Admitting: Emergency Medicine

## 2012-02-05 DIAGNOSIS — IMO0002 Reserved for concepts with insufficient information to code with codable children: Secondary | ICD-10-CM | POA: Insufficient documentation

## 2012-02-05 DIAGNOSIS — F17200 Nicotine dependence, unspecified, uncomplicated: Secondary | ICD-10-CM | POA: Insufficient documentation

## 2012-02-05 DIAGNOSIS — M5416 Radiculopathy, lumbar region: Secondary | ICD-10-CM

## 2012-02-05 MED ORDER — CYCLOBENZAPRINE HCL 10 MG PO TABS: 10.0000 mg | ORAL_TABLET | Freq: Three times a day (TID) | ORAL | Status: DC | PRN

## 2012-02-05 MED ORDER — NAPROXEN 500 MG PO TABS: 500.0000 mg | ORAL_TABLET | Freq: Two times a day (BID) | ORAL | Status: DC

## 2012-02-05 MED ORDER — CYCLOBENZAPRINE HCL 10 MG PO TABS
10.0000 mg | ORAL_TABLET | Freq: Once | ORAL | Status: AC
  Administered 2012-02-05: 10 mg via ORAL
  Filled 2012-02-05: qty 1

## 2012-02-05 MED ORDER — KETOROLAC TROMETHAMINE 60 MG/2ML IM SOLN
60.0000 mg | Freq: Once | INTRAMUSCULAR | Status: AC
  Administered 2012-02-05: 60 mg via INTRAMUSCULAR
  Filled 2012-02-05: qty 2

## 2012-02-05 MED ORDER — HYDROCODONE-ACETAMINOPHEN 5-325 MG PO TABS: ORAL_TABLET | ORAL | Status: DC

## 2012-02-05 NOTE — ED Provider Notes (Signed)
History     CSN: [id number]  Arrival date & time 02/05/12  1630   First MD Initiated Contact with Patient 02/05/12 1643      Chief Complaint  Patient presents with  . Back Pain    (Consider location/radiation/quality/duration/timing/severity/associated sxs/prior treatment) HPI Comments: Patient c/o left lower back pain since yesterday.  States he was bent over underneath his house and felt a "pulling sensation " to his lower back as he tried to stand up.  States pain is radiating into the left buttocks and thigh.  Pain is worse with movement and standing.  Improves with certain positions.  He denies numbness, weakness of the LE's, incontinence of bowel or bladder,  or saddle anesthesia's.    Patient is a 32 y.o. male presenting with back pain. The history is provided by the patient.  Back Pain  This is a new problem. The problem occurs constantly. The problem has not changed since onset.The pain is present in the lumbar spine and sacro-iliac joint. The quality of the pain is described as aching. The pain radiates to the left thigh, left knee and left foot. The pain is moderate. The pain is the same all the time. Associated symptoms include leg pain. Pertinent negatives include no chest pain, no fever, no numbness, no abdominal pain, no abdominal swelling, no bowel incontinence, no perianal numbness, no bladder incontinence, no dysuria, no pelvic pain, no paresthesias, no paresis, no tingling and no weakness. He has tried nothing for the symptoms. The treatment provided no relief.    History reviewed. No pertinent past medical history.  Past Surgical History  Procedure Date  . Hand surgery     Family History  Problem Relation Age of Onset  . Diabetes Mother   . Diabetes Father     History  Substance Use Topics  . Smoking status: Current Every Day Smoker -- 1.0 packs/day  . Smokeless tobacco: Not on file  . Alcohol Use: No      Review of Systems  Constitutional: Negative  for fever.  Respiratory: Negative for shortness of breath.   Cardiovascular: Negative for chest pain.  Gastrointestinal: Negative for vomiting, abdominal pain, constipation and bowel incontinence.  Genitourinary: Negative for bladder incontinence, dysuria, urgency, hematuria, flank pain, decreased urine volume, difficulty urinating and pelvic pain.       No perineal numbness or incontinence of urine or feces  Musculoskeletal: Positive for back pain. Negative for joint swelling.  Skin: Negative for rash.  Neurological: Negative for tingling, weakness, numbness and paresthesias.  All other systems reviewed and are negative.    Allergies  Review of patient's allergies indicates no known allergies.  Home Medications   Current Outpatient Rx  Name  Route  Sig  Dispense  Refill  . IBUPROFEN 600 MG PO TABS   Oral   Take 1 tablet (600 mg total) by mouth 3 (three) times daily.   21 tablet   0     BP 153/91  Pulse 80  Temp 97.6 F (36.4 C) (Oral)  Resp 16  Ht 6\' 3"  (1.905 m)  Wt 245 lb (111.131 kg)  BMI 30.62 kg/m2  SpO2 97%  Physical Exam  Nursing note and vitals reviewed. Constitutional: He is oriented to person, place, and time. He appears well-developed and well-nourished. No distress.  HENT:  Head: Normocephalic and atraumatic.  Neck: Normal range of motion. Neck supple.  Cardiovascular: Normal rate, regular rhythm and intact distal pulses.   No murmur heard. Pulmonary/Chest: Effort normal and  breath sounds normal. He exhibits no tenderness.  Musculoskeletal: He exhibits tenderness. He exhibits no edema.       Lumbar back: He exhibits tenderness and pain. He exhibits normal range of motion, no bony tenderness, no swelling, no deformity, no laceration and normal pulse.       Back:       ttp of the left lumbar paraspinal muscles and left SI joint space.  Dp pulses are brisk and symmetrical.  No calf pain or edema.  Distal sensation intact  Neurological: He is alert and  oriented to person, place, and time. No cranial nerve deficit or sensory deficit. He exhibits normal muscle tone. Coordination and gait normal.  Reflex Scores:      Patellar reflexes are 2+ on the right side and 2+ on the left side.      Achilles reflexes are 2+ on the right side and 2+ on the left side. Skin: Skin is warm and dry.    ED Course  Procedures (including critical care time)  Labs Reviewed - No data to display No results found.      MDM    Patient has ttp of the left lumbar paraspinal muscles and left SI joint space.  No focal neuro deficits on exam.  Ambulates with a slow but steady gait.   Doubt emergent neurological or infectious process.    Patient requests referral info.  Given info for Dr. Romeo Apple and Dr. Janna Arch  Prescribed: Flexeril Norco #20 naprosyn  Etheline Geppert L. [place], [place] 02/05/12 1759

## 2012-02-05 NOTE — ED Provider Notes (Signed)
Medical screening examination/treatment/procedure(s) were performed by non-physician practitioner and as supervising physician I was immediately available for consultation/collaboration.  Lamerle Jabs, MD 02/05/12 2052 

## 2012-02-05 NOTE — ED Notes (Signed)
Lower back pain. States he felt something tighten up in his back when he turned. Pain radiating down his left leg.

## 2013-05-01 ENCOUNTER — Emergency Department (HOSPITAL_COMMUNITY): Payer: Self-pay

## 2013-05-01 ENCOUNTER — Emergency Department (HOSPITAL_COMMUNITY)
Admission: EM | Admit: 2013-05-01 | Discharge: 2013-05-02 | Disposition: A | Discharge status: Home / Self Care | Payer: Self-pay | Attending: Emergency Medicine | Admitting: Emergency Medicine

## 2013-05-01 ENCOUNTER — Encounter (HOSPITAL_COMMUNITY): Payer: Self-pay | Admitting: Emergency Medicine

## 2013-05-01 DIAGNOSIS — H05019 Cellulitis of unspecified orbit: Secondary | ICD-10-CM | POA: Insufficient documentation

## 2013-05-01 DIAGNOSIS — E119 Type 2 diabetes mellitus without complications: Secondary | ICD-10-CM | POA: Insufficient documentation

## 2013-05-01 DIAGNOSIS — F17200 Nicotine dependence, unspecified, uncomplicated: Secondary | ICD-10-CM | POA: Insufficient documentation

## 2013-05-01 DIAGNOSIS — R51 Headache: Secondary | ICD-10-CM | POA: Insufficient documentation

## 2013-05-01 DIAGNOSIS — J3489 Other specified disorders of nose and nasal sinuses: Secondary | ICD-10-CM | POA: Insufficient documentation

## 2013-05-01 DIAGNOSIS — L03213 Periorbital cellulitis: Secondary | ICD-10-CM

## 2013-05-01 MED ORDER — SODIUM CHLORIDE 0.9 % IV SOLN: INTRAVENOUS | Status: DC

## 2013-05-01 NOTE — ED Provider Notes (Signed)
CSN: [id number]     Arrival date & time 05/01/13  2203 History   First MD Initiated Contact with Patient 05/01/13 2238     Chief Complaint  Patient presents with  . Facial Swelling     (Consider location/radiation/quality/duration/timing/severity/associated sxs/prior Treatment) Patient is a 33 y.o. male presenting with URI. The history is provided by the patient.  URI Presenting symptoms: congestion and facial pain   Presenting symptoms: no fever and no sore throat   Severity:  Moderate Onset quality:  Gradual Duration:  24 hours Timing:  Constant Progression:  Worsening Chronicity:  New Relieved by:  Nothing Worsened by:  Nothing tried Ineffective treatments:  OTC medications and decongestant Associated symptoms: headaches and sinus pain   Associated symptoms: no arthralgias, no myalgias, no neck pain, no sneezing, no swollen glands and no wheezing    Lemar LivingsKevin L Farner is a 33 y.o. male who presents to the ED with sinus congestion. History reviewed. No pertinent past medical history. Past Surgical History  Procedure Laterality Date  . Hand surgery     Family History  Problem Relation Age of Onset  . Diabetes Mother   . Diabetes Father    History  Substance Use Topics  . Smoking status: Current Every Day Smoker -- 1.00 packs/day  . Smokeless tobacco: Not on file  . Alcohol Use: No    Review of Systems  Constitutional: Negative for fever and chills.  HENT: Positive for congestion and sinus pressure. Negative for sneezing and sore throat.   Eyes: Positive for pain and redness. Negative for visual disturbance.  Respiratory: Negative for wheezing.   Gastrointestinal: Negative for nausea, vomiting and abdominal pain.  Genitourinary: Negative for dysuria, urgency and frequency.  Musculoskeletal: Negative for arthralgias, myalgias and neck pain.  Skin: Negative for rash.  Neurological: Positive for headaches. Negative for syncope and light-headedness.   Psychiatric/Behavioral: Negative for confusion. The patient is not nervous/anxious.       Allergies  Review of patient's allergies indicates no known allergies.  Home Medications   Prior to Admission medications   Medication Sig Start Date End Date Taking? Authorizing Provider  pseudoephedrine (SUDAFED) 30 MG tablet Take 30 mg by mouth every 4 (four) hours as needed for congestion.   Yes Historical Provider, MD   BP 150/91  Pulse 97  Temp(Src) 98.1 F (36.7 C) (Oral)  Resp 20  Ht 6\' 2"  (1.88 m)  Wt 220 lb (99.791 kg)  BMI 28.23 kg/m2  SpO2 98% Physical Exam  Nursing note and vitals reviewed. Constitutional: He is oriented to person, place, and time. He appears well-developed and well-nourished.  HENT:  Right Ear: Tympanic membrane normal.  Left Ear: Tympanic membrane normal.  Nose: Mucosal edema, rhinorrhea and sinus tenderness present. Right sinus exhibits maxillary sinus tenderness and frontal sinus tenderness. Left sinus exhibits no maxillary sinus tenderness and no frontal sinus tenderness.  Swelling, erythema and tenderness of the right orbit  Eyes: Conjunctivae and EOM are normal. Pupils are equal, round, and reactive to light.  Neck: Neck supple.  Pulmonary/Chest: Effort normal.  Abdominal: Soft. There is no tenderness.  Musculoskeletal: Normal range of motion.  Neurological: He is alert and oriented to person, place, and time. No cranial nerve deficit.  Skin: Skin is warm and dry.  Psychiatric: He has a normal mood and affect. His behavior is normal.   Results for orders placed during the hospital encounter of 05/01/13 (from the past 24 hour(s))  CBC WITH DIFFERENTIAL     Status:  None   Collection Time    05/01/13 11:37 PM      Result Value Ref Range   WBC 8.1  4.0 - 10.5 K/uL   RBC 4.39  4.22 - 5.81 MIL/uL   Hemoglobin 14.2  13.0 - 17.0 g/dL   HCT 40.940.4  81.139.0 - 91.452.0 %   MCV 92.0  78.0 - 100.0 fL   MCH 32.3  26.0 - 34.0 pg   MCHC 35.1  30.0 - 36.0 g/dL    RDW 78.212.0  95.611.5 - 21.315.5 %   Platelets 297  150 - 400 K/uL   Neutrophils Relative % 58  43 - 77 %   Neutro Abs 4.7  1.7 - 7.7 K/uL   Lymphocytes Relative 31  12 - 46 %   Lymphs Abs 2.5  0.7 - 4.0 K/uL   Monocytes Relative 9  3 - 12 %   Monocytes Absolute 0.8  0.1 - 1.0 K/uL   Eosinophils Relative 2  0 - 5 %   Eosinophils Absolute 0.2  0.0 - 0.7 K/uL   Basophils Relative 0  0 - 1 %   Basophils Absolute 0.0  0.0 - 0.1 K/uL  BASIC METABOLIC PANEL     Status: Abnormal   Collection Time    05/01/13 11:37 PM      Result Value Ref Range   Sodium 141  137 - 147 mEq/L   Potassium 4.3  3.7 - 5.3 mEq/L   Chloride 102  96 - 112 mEq/L   CO2 30  19 - 32 mEq/L   Glucose, Bld 113 (*) 70 - 99 mg/dL   BUN 10  6 - 23 mg/dL   Creatinine, Ser 0.861.01  0.50 - 1.35 mg/dL   Calcium 8.9  8.4 - 57.810.5 mg/dL   GFR calc non Af Amer >90  >90 mL/min   GFR calc Af Amer >90  >90 mL/min    ED Course  Procedures  Ct Maxillofacial W/cm  05/02/2013   CLINICAL DATA:  Facial swelling. Right orbital swelling and redness.  EXAM: CT MAXILLOFACIAL WITH CONTRAST  TECHNIQUE: Multidetector CT imaging of the maxillofacial structures was performed with intravenous contrast. Multiplanar CT image reconstructions were also generated. A small metallic BB was placed on the right temple in order to reliably differentiate right from left.  CONTRAST:  75mL OMNIPAQUE IOHEXOL 300 MG/ML  SOLN  COMPARISON:  CT neck with contrast 05/15/2006.  FINDINGS: Soft tissue swelling noted over the right orbit and cheek region. There are no focal fluid collections to suggest abscess. Findings presumably represent preorbital/facial cellulitis.  Mucosal thickening within the paranasal sinuses, most pronounced in the left maxillary sinus. No air-fluid levels.  Multiple dental caries noted without evidence for periapical abscess.  Mastoid air cells are clear.  No acute bony abnormality.  Orbital soft tissues are otherwise unremarkable. No extension of the inflammatory  process post septal in the right orbit.  IMPRESSION: Mild subcutaneous soft tissue stranding overlying the right orbit and right cheek. No drainable focal fluid collection. No intraorbital extension.  Chronic sinusitis.   Electronically Signed   By: Charlett NoseKevin  Dover M.D.   On: 05/02/2013 01:02    MDM  33 y.o. male with facial pain and erythema. IV Clindamycin now. Will have the patient return tomorrow for recheck.  Dr. Ranae PalmsYelverton to assume care of the patient @ 0120am.    Janne NapoleonHope M Melitta Tigue, NP 05/02/13 [address]  Kenitha Glendinning Orlene OchM Toshie Demelo, NP 05/02/13 [phone]

## 2013-05-01 NOTE — ED Notes (Signed)
Pt states he has been taking mucinex and sudafed for "allergies." Pt has swelling under right eye.  Cheek and lip are also swollen.

## 2013-05-02 LAB — CBC WITH DIFFERENTIAL/PLATELET
BASOS ABS: 0 10*3/uL (ref 0.0–0.1)
BASOS PCT: 0 % (ref 0–1)
Eosinophils Absolute: 0.2 10*3/uL (ref 0.0–0.7)
Eosinophils Relative: 2 % (ref 0–5)
HEMATOCRIT: 40.4 % (ref 39.0–52.0)
HEMOGLOBIN: 14.2 g/dL (ref 13.0–17.0)
LYMPHS PCT: 31 % (ref 12–46)
Lymphs Abs: 2.5 10*3/uL (ref 0.7–4.0)
MCH: 32.3 pg (ref 26.0–34.0)
MCHC: 35.1 g/dL (ref 30.0–36.0)
MCV: 92 fL (ref 78.0–100.0)
Monocytes Absolute: 0.8 10*3/uL (ref 0.1–1.0)
Monocytes Relative: 9 % (ref 3–12)
NEUTROS ABS: 4.7 10*3/uL (ref 1.7–7.7)
NEUTROS PCT: 58 % (ref 43–77)
Platelets: 297 10*3/uL (ref 150–400)
RBC: 4.39 MIL/uL (ref 4.22–5.81)
RDW: 12 % (ref 11.5–15.5)
WBC: 8.1 10*3/uL (ref 4.0–10.5)

## 2013-05-02 LAB — BASIC METABOLIC PANEL
BUN: 10 mg/dL (ref 6–23)
CHLORIDE: 102 meq/L (ref 96–112)
CO2: 30 meq/L (ref 19–32)
Calcium: 8.9 mg/dL (ref 8.4–10.5)
Creatinine, Ser: 1.01 mg/dL (ref 0.50–1.35)
GFR calc non Af Amer: >90 mL/min (ref >90)
Glucose, Bld: 113 mg/dL — ABNORMAL HIGH (ref 70–99)
POTASSIUM: 4.3 meq/L (ref 3.7–5.3)
Sodium: 141 mEq/L (ref 137–147)

## 2013-05-02 MED ORDER — IOHEXOL 300 MG/ML  SOLN
75.0000 mL | Freq: Once | INTRAMUSCULAR | Status: AC | PRN
  Administered 2013-05-02: 75 mL via INTRAVENOUS

## 2013-05-02 MED ORDER — CLINDAMYCIN HCL 150 MG PO CAPS: 150.0000 mg | ORAL_CAPSULE | Freq: Four times a day (QID) | ORAL | Status: DC

## 2013-05-02 MED ORDER — CLINDAMYCIN PHOSPHATE 600 MG/50ML IV SOLN
600.0000 mg | Freq: Once | INTRAVENOUS | Status: AC
  Administered 2013-05-02: 600 mg via INTRAVENOUS
  Filled 2013-05-02: qty 50

## 2013-05-02 MED ORDER — HYDROCODONE-ACETAMINOPHEN 5-325 MG PO TABS: 1.0000 | ORAL_TABLET | ORAL | Status: DC | PRN

## 2013-05-02 NOTE — ED Provider Notes (Signed)
Medical screening examination/treatment/procedure(s) were conducted as a shared visit with non-physician practitioner(s) and myself.  I personally evaluated the patient during the encounter.   EKG Interpretation None      Patient with right infraorbital swelling, erythema and tenderness consistent with facial cellulitis. There is no oral involvement. No pain with range of motion of the eye. Patient was given an IV dose of clindamycin in the emergency department and is advised to return in 24-48 hours for reevaluation. If the cellulitis has progressed he may need inpatient treatment with IV antibiotics.  Loren Raceravid Melitza Metheny, MD 05/02/13 [phone]

## 2013-05-02 NOTE — Discharge Instructions (Signed)
Return tomorrow for recheck. Take the medication as directed. Do not take the narcotic if you are driving as it will make you sleepy.

## 2014-01-17 ENCOUNTER — Emergency Department (HOSPITAL_COMMUNITY)
Admission: EM | Admit: 2014-01-17 | Discharge: 2014-01-17 | Disposition: A | Discharge status: Home / Self Care | Payer: Self-pay | Attending: Emergency Medicine | Admitting: Emergency Medicine

## 2014-01-17 ENCOUNTER — Encounter (HOSPITAL_COMMUNITY): Payer: Self-pay | Admitting: *Deleted

## 2014-01-17 DIAGNOSIS — K029 Dental caries, unspecified: Secondary | ICD-10-CM | POA: Insufficient documentation

## 2014-01-17 DIAGNOSIS — Z720 Tobacco use: Secondary | ICD-10-CM | POA: Insufficient documentation

## 2014-01-17 MED ORDER — AMOXICILLIN 500 MG PO CAPS: 1000.0000 mg | ORAL_CAPSULE | Freq: Two times a day (BID) | ORAL | Status: DC

## 2014-01-17 MED ORDER — OXYCODONE-ACETAMINOPHEN 5-325 MG PO TABS: 1.0000 | ORAL_TABLET | ORAL | Status: DC | PRN

## 2014-01-17 MED ORDER — AMOXICILLIN 250 MG PO CAPS
1000.0000 mg | ORAL_CAPSULE | Freq: Once | ORAL | Status: AC
  Administered 2014-01-17: 1000 mg via ORAL
  Filled 2014-01-17: qty 4

## 2014-01-17 NOTE — ED Provider Notes (Signed)
CSN: [id number]     Arrival date & time 01/17/14  0550 History   First MD Initiated Contact with Patient 01/17/14 [phone]     Chief complaint: Toothache  (Consider location/radiation/quality/duration/timing/severity/associated sxs/prior Treatment) The history is provided by the patient.   34 year old male complains of pain in the right lower jaw for about the last month. Pain is been waxing and waning but has been worse over the last 2 days. Has been taking over-the-counter acetaminophen and ibuprofen without relief. Pain is rated at 10/10. It is worse me tries to chew and worse with exposure to cold air. He had several teeth extracted about one year ago and states intestinal insurances clinic again in about one month he was trying to wait until that happened.  No past medical history on file. Past Surgical History  Procedure Laterality Date  . Hand surgery     Family History  Problem Relation Age of Onset  . Diabetes Mother   . Diabetes Father    History  Substance Use Topics  . Smoking status: Current Every Day Smoker -- 1.00 packs/day  . Smokeless tobacco: Not on file  . Alcohol Use: No    Review of Systems  All other systems reviewed and are negative.     Allergies  Review of patient's allergies indicates no known allergies.  Home Medications   Prior to Admission medications   Medication Sig Start Date End Date Taking? Authorizing Provider  clindamycin (CLEOCIN) 150 MG capsule Take 1 capsule (150 mg total) by mouth every 6 (six) hours. 05/02/13   Hope Orlene Och, NP  HYDROcodone-acetaminophen (NORCO/VICODIN) 5-325 MG per tablet Take 1 tablet by mouth every 4 (four) hours as needed. 05/02/13   Hope Orlene Och, NP  pseudoephedrine (SUDAFED) 30 MG tablet Take 30 mg by mouth every 4 (four) hours as needed for congestion.    Historical Provider, MD   BP 150/88 mmHg  Pulse 77  Temp(Src) 98.3 F (36.8 C) (Oral)  Resp 20  SpO2 100% Physical Exam  Nursing note and vitals  reviewed.  34 year old male, resting comfortably and in no acute distress. Vital signs are significant for hypertension. Oxygen saturation is 100%, which is normal. Head is normocephalic and atraumatic. PERRLA, EOMI. Oropharynx is clear. Overall dentition is poor. Tooth #32 is carious and tender to percussion. Teeth #30 and 31 have been previously extracted. Neck is nontender and supple without adenopathy or JVD. Back is nontender and there is no CVA tenderness. Lungs are clear without rales, wheezes, or rhonchi. Chest is nontender. Heart has regular rate and rhythm without murmur. Abdomen is soft, flat, nontender without masses or hepatosplenomegaly and peristalsis is normoactive. Extremities have no cyanosis or edema, full range of motion is present. Skin is warm and dry without rash. Neurologic: Mental status is normal, cranial nerves are intact, there are no motor or sensory deficits.  ED Course  Procedures (including critical care time)  MDM   Final diagnoses:  Dental caries    Dental pain from dental caries into the #32. He is discharged with prescription for amoxicillin and oxycodone-acetaminophen and is advised to see a dentist for definitive care. He is given resource guide to try to find a dentist who can work with him before his insurance kicks in.    Dione Booze, MD 01/17/14 [phone]

## 2014-01-17 NOTE — ED Notes (Signed)
Pt c/o pain and swelling to the right lower side of his jaw. Pt states he thinks a tooth is infected.

## 2014-01-17 NOTE — Discharge Instructions (Signed)
Dental Caries °Dental caries (also called tooth decay) is the most common oral disease. It can occur at any age but is more common in children and young adults.  °HOW DENTAL CARIES DEVELOPS  °The process of decay begins when bacteria and foods (particularly sugars and starches) combine in your mouth to produce plaque. Plaque is a substance that sticks to the hard, outer surface of a tooth (enamel). The bacteria in plaque produce acids that attack enamel. These acids may also attack the root surface of a tooth (cementum) if it is exposed. Repeated attacks dissolve these surfaces and create holes in the tooth (cavities). If left untreated, the acids destroy the other layers of the tooth.  °RISK FACTORS °· Frequent sipping of sugary beverages.   °· Frequent snacking on sugary and starchy foods, especially those that easily get stuck in the teeth.   °· Poor oral hygiene.   °· Dry mouth.   °· Substance abuse such as methamphetamine abuse.   °· Broken or poor-fitting dental restorations.   °· Eating disorders.   °· Gastroesophageal reflux disease (GERD).   °· Certain radiation treatments to the head and neck. °SYMPTOMS °In the early stages of dental caries, symptoms are seldom present. Sometimes white, chalky areas may be seen on the enamel or other tooth layers. In later stages, symptoms may include: °· Pits and holes on the enamel. °· Toothache after sweet, hot, or cold foods or drinks are consumed. °· Pain around the tooth. °· Swelling around the tooth. °DIAGNOSIS  °Most of the time, dental caries is detected during a regular dental checkup. A diagnosis is made after a thorough medical and dental history is taken and the surfaces of your teeth are checked for signs of dental caries. Sometimes special instruments, such as lasers, are used to check for dental caries. Dental X-ray exams may be taken so that areas not visible to the eye (such as between the contact areas of the teeth) can be checked for cavities.    °TREATMENT  °If dental caries is in its early stages, it may be reversed with a fluoride treatment or an application of a remineralizing agent at the dental office. Thorough brushing and flossing at home is needed to aid these treatments. If it is in its later stages, treatment depends on the location and extent of tooth destruction:  °· If a small area of the tooth has been destroyed, the destroyed area will be removed and cavities will be filled with a material such as gold, silver amalgam, or composite resin.   °· If a large area of the tooth has been destroyed, the destroyed area will be removed and a cap (crown) will be fitted over the remaining tooth structure.   °· If the center part of the tooth (pulp) is affected, a procedure called a root canal will be needed before a filling or crown can be placed.   °· If most of the tooth has been destroyed, the tooth may need to be pulled (extracted). °HOME CARE INSTRUCTIONS °You can prevent, stop, or reverse dental caries at home by practicing good oral hygiene. Good oral hygiene includes: °· Thoroughly cleaning your teeth at least twice a day with a toothbrush and dental floss.   °· Using a fluoride toothpaste. A fluoride mouth rinse may also be used if recommended by your dentist or health care provider.   °· Restricting the amount of sugary and starchy foods and sugary liquids you consume.   °· Avoiding frequent snacking on these foods and sipping of these liquids.   °· Keeping regular visits with   a dentist for checkups and cleanings. PREVENTION   Practice good oral hygiene.  Consider a dental sealant. A dental sealant is a coating material that is applied by your dentist to the pits and grooves of teeth. The sealant prevents food from being trapped in them. It may protect the teeth for several years.  Ask about fluoride supplements if you live in a community without fluorinated water or with water that has a low fluoride content. Use fluoride supplements  as directed by your dentist or health care provider.  Allow fluoride varnish applications to teeth if directed by your dentist or health care provider. Document Released: 09/22/2001 Document Revised: 05/17/2013 Document Reviewed: 01/03/2012 [facility] Patient Information 2015 [place], [place]. This information is not intended to replace advice given to you by your health care provider. Make sure you discuss any questions you have with your health care provider.  Amoxicillin capsules or tablets What is this medicine? AMOXICILLIN (a mox i SIL in) is a penicillin antibiotic. It is used to treat certain kinds of bacterial infections. It will not work for colds, flu, or other viral infections. This medicine may be used for other purposes; ask your health care provider or pharmacist if you have questions. COMMON BRAND NAME(S): Amoxil, Moxilin, Sumox, Trimox What should I tell my health care provider before I take this medicine? They need to know if you have any of these conditions: -asthma -kidney disease -an unusual or allergic reaction to amoxicillin, other penicillins, cephalosporin antibiotics, other medicines, foods, dyes, or preservatives -pregnant or trying to get pregnant -breast-feeding How should I use this medicine? Take this medicine by mouth with a glass of water. Follow the directions on your prescription label. You may take this medicine with food or on an empty stomach. Take your medicine at regular intervals. Do not take your medicine more often than directed. Take all of your medicine as directed even if you think your are better. Do not skip doses or stop your medicine early. Talk to your pediatrician regarding the use of this medicine in children. While this drug may be prescribed for selected conditions, precautions do apply. Overdosage: If you think you have taken too much of this medicine contact a poison control center or emergency room at once. NOTE: This medicine is only for  you. Do not share this medicine with others. What if I miss a dose? If you miss a dose, take it as soon as you can. If it is almost time for your next dose, take only that dose. Do not take double or extra doses. What may interact with this medicine? -amiloride -birth control pills -chloramphenicol -macrolides -probenecid -sulfonamides -tetracyclines This list may not describe all possible interactions. Give your health care provider a list of all the medicines, herbs, non-prescription drugs, or dietary supplements you use. Also tell them if you smoke, drink alcohol, or use illegal drugs. Some items may interact with your medicine. What should I watch for while using this medicine? Tell your doctor or health care professional if your symptoms do not improve in 2 or 3 days. Take all of the doses of your medicine as directed. Do not skip doses or stop your medicine early. If you are diabetic, you may get a false positive result for sugar in your urine with certain brands of urine tests. Check with your doctor. Do not treat diarrhea with over-the-counter products. Contact your doctor if you have diarrhea that lasts more than 2 days or if the diarrhea is severe and  watery. °What side effects may I notice from receiving this medicine? °Side effects that you should report to your doctor or health care professional as soon as possible: °-allergic reactions like skin rash, itching or hives, swelling of the face, lips, or tongue °-breathing problems °-dark urine °-redness, blistering, peeling or loosening of the skin, including inside the mouth °-seizures °-severe or watery diarrhea °-trouble passing urine or change in the amount of urine °-unusual bleeding or bruising °-unusually weak or tired °-yellowing of the eyes or skin °Side effects that usually do not require medical attention (report to your doctor or health care professional if they continue or are bothersome): °-dizziness °-headache °-stomach  upset °-trouble sleeping °This list may not describe all possible side effects. Call your doctor for medical advice about side effects. You may report side effects to FDA at 1-800-FDA-1088. °Where should I keep my medicine? °Keep out of the reach of children. °Store between 68 and 77 degrees F (20 and 25 degrees C). Keep bottle closed tightly. Throw away any unused medicine after the expiration date. °NOTE: This sheet is a summary. It may not cover all possible information. If you have questions about this medicine, talk to your doctor, pharmacist, or health care provider. °© 2015, Elsevier/Gold Standard. (2007-03-24 14:10:59) ° °Acetaminophen; Oxycodone tablets °What is this medicine? °ACETAMINOPHEN; OXYCODONE (a set a MEE noe fen; ox i KOE done) is a pain reliever. It is used to treat mild to moderate pain. °This medicine may be used for other purposes; ask your health care provider or pharmacist if you have questions. °COMMON BRAND NAME(S): Endocet, Magnacet, Narvox, Percocet, Perloxx, Primalev, Primlev, Roxicet, Xolox °What should I tell my health care provider before I take this medicine? °They need to know if you have any of these conditions: °-brain tumor °-Crohn's disease, inflammatory bowel disease, or ulcerative colitis °-drug abuse or addiction °-head injury °-heart or circulation problems °-if you often drink alcohol °-kidney disease or problems going to the bathroom °-liver disease °-lung disease, asthma, or breathing problems °-an unusual or allergic reaction to acetaminophen, oxycodone, other opioid analgesics, other medicines, foods, dyes, or preservatives °-pregnant or trying to get pregnant °-breast-feeding °How should I use this medicine? °Take this medicine by mouth with a full glass of water. Follow the directions on the prescription label. Take your medicine at regular intervals. Do not take your medicine more often than directed. °Talk to your pediatrician regarding the use of this medicine in  children. Special care may be needed. °Patients over 65 years old may have a stronger reaction and need a smaller dose. °Overdosage: If you think you have taken too much of this medicine contact a poison control center or emergency room at once. °NOTE: This medicine is only for you. Do not share this medicine with others. °What if I miss a dose? °If you miss a dose, take it as soon as you can. If it is almost time for your next dose, take only that dose. Do not take double or extra doses. °What may interact with this medicine? °-alcohol °-antihistamines °-barbiturates like amobarbital, butalbital, butabarbital, methohexital, pentobarbital, phenobarbital, thiopental, and secobarbital °-benztropine °-drugs for bladder problems like solifenacin, trospium, oxybutynin, tolterodine, hyoscyamine, and methscopolamine °-drugs for breathing problems like ipratropium and tiotropium °-drugs for certain stomach or intestine problems like propantheline, homatropine methylbromide, glycopyrrolate, atropine, belladonna, and dicyclomine °-general anesthetics like etomidate, ketamine, nitrous oxide, propofol, desflurane, enflurane, halothane, isoflurane, and sevoflurane °-medicines for depression, anxiety, or psychotic disturbances °-medicines for sleep °-muscle relaxants °-naltrexone °-  narcotic medicines (opiates) for pain °-phenothiazines like perphenazine, thioridazine, chlorpromazine, mesoridazine, fluphenazine, prochlorperazine, promazine, and trifluoperazine °-scopolamine °-tramadol °-trihexyphenidyl °This list may not describe all possible interactions. Give your health care provider a list of all the medicines, herbs, non-prescription drugs, or dietary supplements you use. Also tell them if you smoke, drink alcohol, or use illegal drugs. Some items may interact with your medicine. °What should I watch for while using this medicine? °Tell your doctor or health care professional if your pain does not go away, if it gets worse,  or if you have new or a different type of pain. You may develop tolerance to the medicine. Tolerance means that you will need a higher dose of the medication for pain relief. Tolerance is normal and is expected if you take this medicine for a long time. °Do not suddenly stop taking your medicine because you may develop a severe reaction. Your body becomes used to the medicine. This does NOT mean you are addicted. Addiction is a behavior related to getting and using a drug for a non-medical reason. If you have pain, you have a medical reason to take pain medicine. Your doctor will tell you how much medicine to take. If your doctor wants you to stop the medicine, the dose will be slowly lowered over time to avoid any side effects. °You may get drowsy or dizzy. Do not drive, use machinery, or do anything that needs mental alertness until you know how this medicine affects you. Do not stand or sit up quickly, especially if you are an older patient. This reduces the risk of dizzy or fainting spells. Alcohol may interfere with the effect of this medicine. Avoid alcoholic drinks. °There are different types of narcotic medicines (opiates) for pain. If you take more than one type at the same time, you may have more side effects. Give your health care provider a list of all medicines you use. Your doctor will tell you how much medicine to take. Do not take more medicine than directed. Call emergency for help if you have problems breathing. °The medicine will cause constipation. Try to have a bowel movement at least every 2 to 3 days. If you do not have a bowel movement for 3 days, call your doctor or health care professional. °Do not take Tylenol (acetaminophen) or medicines that have acetaminophen with this medicine. Too much acetaminophen can be very dangerous. Many nonprescription medicines contain acetaminophen. Always read the labels carefully to avoid taking more acetaminophen. °What side effects may I notice from  receiving this medicine? °Side effects that you should report to your doctor or health care professional as soon as possible: °-allergic reactions like skin rash, itching or hives, swelling of the face, lips, or tongue °-breathing difficulties, wheezing °-confusion °-light headedness or fainting spells °-severe stomach pain °-unusually weak or tired °-yellowing of the skin or the whites of the eyes °Side effects that usually do not require medical attention (report to your doctor or health care professional if they continue or are bothersome): °-dizziness °-drowsiness °-nausea °-vomiting °This list may not describe all possible side effects. Call your doctor for medical advice about side effects. You may report side effects to FDA at 1-800-FDA-1088. °Where should I keep my medicine? °Keep out of the reach of children. This medicine can be abused. Keep your medicine in a safe place to protect it from theft. Do not share this medicine with anyone. Selling or giving away this medicine is dangerous and against the law. °Store at   room temperature between 20 and 25 degrees C (68 and 77 degrees F). Keep container tightly closed. Protect from light. This medicine may cause accidental overdose and death if it is taken by other adults, children, or pets. Flush any unused medicine down the toilet to reduce the chance of harm. Do not use the medicine after the expiration date. NOTE: This sheet is a summary. It may not cover all possible information. If you have questions about this medicine, talk to your doctor, pharmacist, or health care provider.  2015, Elsevier/Gold Standard. (2012-08-24 13:17:35)   Emergency Department Resource Guide 1) Find a Doctor and Pay Out of Pocket Although you won't have to find out who is covered by your insurance plan, it is a good idea to ask around and get recommendations. You will then need to call the office and see if the doctor you have chosen will accept you as a new patient and  what types of options they offer for patients who are self-pay. Some doctors offer discounts or will set up payment plans for their patients who do not have insurance, but you will need to ask so you aren't surprised when you get to your appointment.  2) Contact Your Local Health Department Not all health departments have doctors that can see patients for sick visits, but many do, so it is worth a call to see if yours does. If you don't know where your local health department is, you can check in your phone book. The CDC also has a tool to help you locate your state's health department, and many state websites also have listings of all of their local health departments.  3) Find a Walk-in Clinic If your illness is not likely to be very severe or complicated, you may want to try a walk in clinic. These are popping up all over the country in pharmacies, drugstores, and shopping centers. They're usually staffed by nurse practitioners or physician assistants that have been trained to treat common illnesses and complaints. They're usually fairly quick and inexpensive. However, if you have serious medical issues or chronic medical problems, these are probably not your best option.  No Primary Care Doctor: - Call Health Connect at  [phone] - they can help you locate a primary care doctor that  accepts your insurance, provides certain services, etc. - Physician Referral Service- [phone]  Chronic Pain Problems: Organization         Address  Phone   Notes  [facility]  [phone] Patients need to be referred by their primary care doctor.   Medication Assistance: Organization         Address  Phone   Notes  [facility]ication [facility] [address] [place], [place] [address] [phone] --Must be a resident of [facility] [place] -- Must have NO insurance coverage whatsoever (no Medicaid/ Medicare, etc.) -- The pt. MUST have a primary care doctor  that directs their care regularly and follows them in the community   Med[facility]ist  [phone]   [employer]  [phone]    Agencies that provide inexpensive medical care: Organization         Address  Phone   Notes  [facility]  [phone]   [facility]    [phone]   [facility] [address] [place], [place] [address] [phone]   [facility] [place] [address], [place] [phone]  [phone]   [facility]    [phone]   [facility]    [phone]   [facility] and [facility]  [address], [place] Phone:  [phone], Fax:  [phone] Hours of Operation:  9 am - 6 pm, M-F.  Also accepts Medicaid/Medicare and self-pay.  [facility]  [address], [place] Phone: [phone], Fax: [phone]. Hours of Operation:  8:30 am - 5:30 pm, M-F.  Also accepts Medicaid and self-pay.  [facility] [place] [address], [place] Phone: [phone]   [facility] [address], [place], [place] [phone], Ext. [phone] Mondays & Thursdays: 7-9 AM.  First 15 patients are seen on a first come, first serve basis.    Medicaid-accepting [facility] Providers:  Organization         Address  Phone   Notes  [facility] [address], [place] [phone] Also accepts self-pay patients.  [facility] [address], [place]  [phone]   [facility] [address], [place] [phone]   [facility] [address] [place] [address], [place] [phone]   [facility] Bland [address], [place]   [phone] Only accepts [place] patients after they have their name applied to their card.   Self-Pay (no insurance) in [facility]:  Organization          Address  Phone   Notes  Sickle Cell Patients, [facility] [address], [place] [phone]   [facility] [address], [place] [phone]   [facility] [place]  [address], [place] [phone]   [facility]/Dr. Osei-Bonsu  [address] [place] [address] [place] [address], [place] or [phone], [place] [phone] Phone number for both [place] and [place] locations is the same.  [facility] [address], [place] [phone]   [facility] [address] [place] [address], [place] or [address] [phone]   [facility] [address], [place] [phone], phone; [phone], fax Sees patients 1st and 3rd Saturday of every month.  Must not qualify for public or private insurance (i.e. Medicaid, Medicare, [place] Health Choice, Veterans' Benefits)  Household income should be no more than 200% of the poverty level The clinic cannot treat you if you are pregnant or think you are pregnant  Sexually transmitted diseases are not treated at the clinic.    Dental Care: Organization         Address  Phone  Notes  [facility] [address], [place] [phone] Accepts children up to age 36 who are enrolled in [place] or [place] Health Choice; pregnant women with a Medicaid card; and children who have applied for Medicaid or [place] Health Choice, but were declined, whose parents can pay a reduced fee at time of service.  [facility] [place]  [address], [place] [phone] Accepts children up to age 36 who are enrolled in [place] or [place] Health Choice; pregnant women with a Medicaid card; and children who have applied for Medicaid or  Health Choice, but  were declined, whose parents can pay a reduced fee at time of  service.  [facility]  [address] [place], [place] [phone] Patients are seen by appointment only. Walk-ins are not accepted. [facility] will see patients 75 years of age and older. Monday - Tuesday (8am-5pm) Most Wednesdays (8:30-5pm) $30 per visit, cash only  [facility]  [address], [facility] [phone] Patients are seen by appointment only. Walk-ins are not accepted. [facility] will see patients 12 years of age and older. One Wednesday Evening (Monthly: Volunteer Based).  $30 per visit, cash only  [employer]  [phone] for adults; Children under age 25, call Graduate Pediatric Dentistry at [phone]. Children aged 3-14, please call [phone] to request a pediatric application.  Dental services are provided in all areas of dental care including fillings, crowns and bridges, complete and partial dentures, implants, gum treatment, root canals, and extractions. Preventive care is also provided. Treatment is provided to both adults and children. Patients are selected via a lottery and there is often a waiting list.   [facility] [address], [place]  [phone] [email or web address]   [facility] [address] [place], [place] [phone], Ext. 123 Second and Fourth Thursday of each month, opens at 6:30 AM; Clinic ends at 9 AM.  Patients are seen on a first-come first-served basis, and a limited number are seen during each clinic.   [facility]  [address] [place], [place] [phone]   Eligibility Requirements You must have lived in [place], [place], or [place] counties for at least the last three months.   You cannot be eligible for state or federal sponsored [place], including [employer], [place], or [employer].   You generally cannot be eligible for healthcare insurance through your employer.     How to apply: Eligibility screenings are held every Tuesday and Wednesday afternoon from 1:00 pm until 4:00 pm. You do not need an appointment for the interview!  [facility] [address] [place], [place] [phone]   [facility] Health Department  [phone]   [facility] Health Department  [phone]   [facility] Health Department  [phone]    Behavioral Health Resources in the Community: Intensive Outpatient Programs Organization         Address  Phone  Notes  [facility] [address] [place], [place] [phone]   [facility] Outpatient [address], [place], [place] [phone]   [facility] [address] [place], [place]  [phone]   [facility] [address]  [place], [place] [phone] or [phone]   Substance Abuse Resources Organization         Address  Phone  Notes  [facility]  [phone]   [facility]  [phone]   [facility]  [phone]   Floydene Flock  [phone]   Residential & Outpatient Substance Abuse Program  [phone]   Psychological Services Organization         Address  Phone  Notes  [facility]  [phone]   [facility]  [phone]   [facility] [address] [place] [phone] or [phone]    Mobile Crisis Teams Organization         Address  Phone  Notes  [facility], Mobile Crisis Care Unit  [phone]   [facility]  [address] [place], [place] [phone]   [facility] [address] [place] [phone]    Self-Help/Support Groups Organization         Address  Phone             Notes  [facility] [place] - variety of support groups  336- I7437963 Call for more information  Narcotics Anonymous (NA), [facility] [address]  Dr, [employer] [place]  2 meetings at this location   Statistician         Address  Phone  Notes  [facility] [address],    [place]  [phone]   [facility]  [address], [place] [address], [place], [place] [phone]   [facility] [address] [place], [place] [phone] Admissions: 8am-3pm M-F  [facility] [address]    [place], [place] [phone]   [facility] [address] [place], [place], [place] [phone]   [facility] [address],  [place], [place] [phone]   [facility] - Intensive Outpatient [address], [place], [place] [phone]   [facility] ([facility] [address] [place]  [place], [place] [phone] or [phone]   [facility] ([facility]) [address], [place], [place] [phone] Accepts Medicaid  [facility] [address],  [place] [phone] Substance Abuse/Addiction Treatment   [facility] Organization         Address  Phone  Notes  [facility]  [phone]   Angie Fava, PhD [address] Ervin Knack [place], [place]   [phone] or 3165042489   [facility]al   [address] [place], [place] [phone]   [facility] [address] [place], [place] [phone] Insurance/Medicaid/sponsorship through [facility] and Families [address]                                    [place], [place] [phone] Therapy/tele-psych/case  [facility] [address], [place] [phone]    Dr. Lolly Mustache  [phone]   [facility]  [employer] [facility] Dept. 1) [address], [place] 2) [address], [place] 3)  [address], [place] [phone]  [phone]   [facility] Child Abuse  Hotline [phone] or [phone] (After Hours)

## 2014-04-24 ENCOUNTER — Encounter (HOSPITAL_COMMUNITY): Payer: Self-pay | Admitting: Emergency Medicine

## 2014-04-24 ENCOUNTER — Emergency Department (HOSPITAL_COMMUNITY)
Admission: EM | Admit: 2014-04-24 | Discharge: 2014-04-24 | Disposition: A | Discharge status: Home / Self Care | Payer: Self-pay | Attending: Emergency Medicine | Admitting: Emergency Medicine

## 2014-04-24 DIAGNOSIS — M545 Low back pain: Secondary | ICD-10-CM | POA: Insufficient documentation

## 2014-04-24 DIAGNOSIS — Z720 Tobacco use: Secondary | ICD-10-CM | POA: Insufficient documentation

## 2014-04-24 DIAGNOSIS — Z9889 Other specified postprocedural states: Secondary | ICD-10-CM | POA: Insufficient documentation

## 2014-04-24 MED ORDER — BACLOFEN 10 MG PO TABS: 10.0000 mg | ORAL_TABLET | Freq: Three times a day (TID) | ORAL | Status: AC

## 2014-04-24 MED ORDER — DEXAMETHASONE 4 MG PO TABS: 4.0000 mg | ORAL_TABLET | Freq: Two times a day (BID) | ORAL | Status: DC

## 2014-04-24 MED ORDER — DIAZEPAM 5 MG PO TABS
10.0000 mg | ORAL_TABLET | Freq: Once | ORAL | Status: AC
  Administered 2014-04-24: 10 mg via ORAL
  Filled 2014-04-24: qty 2

## 2014-04-24 MED ORDER — DICLOFENAC SODIUM 75 MG PO TBEC: 75.0000 mg | DELAYED_RELEASE_TABLET | Freq: Two times a day (BID) | ORAL | Status: DC

## 2014-04-24 MED ORDER — DEXAMETHASONE SODIUM PHOSPHATE 4 MG/ML IJ SOLN
8.0000 mg | Freq: Once | INTRAMUSCULAR | Status: AC
  Administered 2014-04-24: 8 mg via INTRAMUSCULAR
  Filled 2014-04-24: qty 2

## 2014-04-24 MED ORDER — KETOROLAC TROMETHAMINE 10 MG PO TABS
10.0000 mg | ORAL_TABLET | Freq: Once | ORAL | Status: AC
  Administered 2014-04-24: 10 mg via ORAL
  Filled 2014-04-24: qty 1

## 2014-04-24 NOTE — ED Notes (Signed)
Patient c/o left lower back. Per patient laid newborn child on couch yesterday and when he went to turn around he felt a "pulling" sensation in back. Per patient had difficulty standing straight up after that and certain movements "take his breath away." Denies any difficult with urination or BMs. Reports taking tylenol with no relief. Pain worse with movement or pressure.
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
## 2014-04-24 NOTE — Discharge Instructions (Signed)
Heating pad to your back maybe helpful. Please use medications as prescribed. Baclofen may cause drowsiness. Please use this medication with caution. Please see the orthopedic specialist listed above or the orthopedic specialist of your choice concerning your back. Back Pain, Adult Low back pain is very common. About 1 in 5 people have back pain.The cause of low back pain is rarely dangerous. The pain often gets better over time.About half of people with a sudden onset of back pain feel better in just 2 weeks. About 8 in 10 people feel better by 6 weeks.  CAUSES Some common causes of back pain include:  Strain of the muscles or ligaments supporting the spine.  Wear and tear (degeneration) of the spinal discs.  Arthritis.  Direct injury to the back. DIAGNOSIS Most of the time, the direct cause of low back pain is not known.However, back pain can be treated effectively even when the exact cause of the pain is unknown.Answering your caregiver's questions about your overall health and symptoms is one of the most accurate ways to make sure the cause of your pain is not dangerous. If your caregiver needs more information, he or she may order lab work or imaging tests (X-rays or MRIs).However, even if imaging tests show changes in your back, this usually does not require surgery. HOME CARE INSTRUCTIONS For many people, back pain returns.Since low back pain is rarely dangerous, it is often a condition that people can learn to [facility] their own.   Remain active. It is stressful on the back to sit or stand in one place. Do not sit, drive, or stand in one place for more than 30 minutes at a time. Take short walks on level surfaces as soon as pain allows.Try to increase the length of time you walk each day.  Do not stay in bed.Resting more than 1 or 2 days can delay your recovery.  Do not avoid exercise or work.Your body is made to move.It is not dangerous to be active, even though your back  may hurt.Your back will likely heal faster if you return to being active before your pain is gone.  Pay attention to your body when you bend and lift. Many people have less discomfortwhen lifting if they bend their knees, keep the load close to their bodies,and avoid twisting. Often, the most comfortable positions are those that put less stress on your recovering back.  Find a comfortable position to sleep. Use a firm mattress and lie on your side with your knees slightly bent. If you lie on your back, put a pillow under your knees.  Only take over-the-counter or prescription medicines as directed by your caregiver. Over-the-counter medicines to reduce pain and inflammation are often the most helpful.Your caregiver may prescribe muscle relaxant drugs.These medicines help dull your pain so you can more quickly return to your normal activities and healthy exercise.  Put ice on the injured area.  Put ice in a plastic bag.  Place a towel between your skin and the bag.  Leave the ice on for 15-20 minutes, 03-04 times a day for the first 2 to 3 days. After that, ice and heat may be alternated to reduce pain and spasms.  Ask your caregiver about trying back exercises and gentle massage. This may be of some benefit.  Avoid feeling anxious or stressed.Stress increases muscle tension and can worsen back pain.It is important to recognize when you are anxious or stressed and learn ways to manage it.Exercise is a great option. SEEK  MEDICAL CARE IF:  You have pain that is not relieved with rest or medicine.  You have pain that does not improve in 1 week.  You have new symptoms.  You are generally not feeling well. SEEK IMMEDIATE MEDICAL CARE IF:   You have pain that radiates from your back into your legs.  You develop new bowel or bladder control problems.  You have unusual weakness or numbness in your arms or legs.  You develop nausea or vomiting.  You develop abdominal pain.  You  feel faint. Document Released: 12/31/2004 Document Revised: 07/02/2011 Document Reviewed: 05/04/2013 [facility] Patient Information 2015 [place], [place]. This information is not intended to replace advice given to you by your health care provider. Make sure you discuss any questions you have with your health care provider.

## 2014-05-23 ENCOUNTER — Emergency Department (HOSPITAL_COMMUNITY): Payer: Self-pay

## 2014-05-23 ENCOUNTER — Emergency Department (HOSPITAL_COMMUNITY)
Admission: EM | Admit: 2014-05-23 | Discharge: 2014-05-23 | Disposition: A | Discharge status: Home / Self Care | Payer: Self-pay | Attending: Emergency Medicine | Admitting: Emergency Medicine

## 2014-05-23 ENCOUNTER — Encounter (HOSPITAL_COMMUNITY): Payer: Self-pay | Admitting: Intensive Care

## 2014-05-23 DIAGNOSIS — Z720 Tobacco use: Secondary | ICD-10-CM | POA: Insufficient documentation

## 2014-05-23 DIAGNOSIS — Z7952 Long term (current) use of systemic steroids: Secondary | ICD-10-CM | POA: Insufficient documentation

## 2014-05-23 DIAGNOSIS — Y998 Other external cause status: Secondary | ICD-10-CM | POA: Insufficient documentation

## 2014-05-23 DIAGNOSIS — Z791 Long term (current) use of non-steroidal anti-inflammatories (NSAID): Secondary | ICD-10-CM | POA: Insufficient documentation

## 2014-05-23 DIAGNOSIS — Y9389 Activity, other specified: Secondary | ICD-10-CM | POA: Insufficient documentation

## 2014-05-23 DIAGNOSIS — S3992XA Unspecified injury of lower back, initial encounter: Secondary | ICD-10-CM | POA: Insufficient documentation

## 2014-05-23 DIAGNOSIS — S29092A Other injury of muscle and tendon of back wall of thorax, initial encounter: Secondary | ICD-10-CM | POA: Insufficient documentation

## 2014-05-23 DIAGNOSIS — Z792 Long term (current) use of antibiotics: Secondary | ICD-10-CM | POA: Insufficient documentation

## 2014-05-23 DIAGNOSIS — Z79899 Other long term (current) drug therapy: Secondary | ICD-10-CM | POA: Insufficient documentation

## 2014-05-23 DIAGNOSIS — S4992XA Unspecified injury of left shoulder and upper arm, initial encounter: Secondary | ICD-10-CM | POA: Insufficient documentation

## 2014-05-23 DIAGNOSIS — Y92410 Unspecified street and highway as the place of occurrence of the external cause: Secondary | ICD-10-CM | POA: Insufficient documentation

## 2014-05-23 MED ORDER — NAPROXEN 500 MG PO TABS: 500.0000 mg | ORAL_TABLET | Freq: Two times a day (BID) | ORAL | Status: DC

## 2014-05-23 MED ORDER — HYDROCODONE-ACETAMINOPHEN 5-325 MG PO TABS: 1.0000 | ORAL_TABLET | Freq: Four times a day (QID) | ORAL | Status: DC | PRN
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
## 2014-05-23 NOTE — Discharge Instructions (Signed)
Follow up with dr. Harrison if not improving. °

## 2014-05-24 ENCOUNTER — Telehealth: Payer: Self-pay | Admitting: Orthopedic Surgery
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
## 2014-05-25 NOTE — Telephone Encounter (Signed)
Called back to patient; scheduled appointment accordingly. °

## 2014-06-06 ENCOUNTER — Ambulatory Visit: Payer: Self-pay | Admitting: Orthopedic Surgery

## 2014-11-26 ENCOUNTER — Emergency Department (HOSPITAL_COMMUNITY)
Admission: EM | Admit: 2014-11-26 | Discharge: 2014-11-26 | Disposition: A | Discharge status: Home / Self Care | Payer: Self-pay | Attending: Emergency Medicine | Admitting: Emergency Medicine

## 2014-11-26 ENCOUNTER — Emergency Department (HOSPITAL_COMMUNITY): Payer: Self-pay

## 2014-11-26 ENCOUNTER — Other Ambulatory Visit: Payer: Self-pay

## 2014-11-26 ENCOUNTER — Encounter (HOSPITAL_COMMUNITY): Payer: Self-pay | Admitting: Emergency Medicine

## 2014-11-26 DIAGNOSIS — Z720 Tobacco use: Secondary | ICD-10-CM | POA: Insufficient documentation

## 2014-11-26 DIAGNOSIS — Z791 Long term (current) use of non-steroidal anti-inflammatories (NSAID): Secondary | ICD-10-CM | POA: Insufficient documentation

## 2014-11-26 DIAGNOSIS — J4521 Mild intermittent asthma with (acute) exacerbation: Secondary | ICD-10-CM | POA: Insufficient documentation

## 2014-11-26 DIAGNOSIS — J40 Bronchitis, not specified as acute or chronic: Secondary | ICD-10-CM

## 2014-11-26 DIAGNOSIS — J069 Acute upper respiratory infection, unspecified: Secondary | ICD-10-CM | POA: Insufficient documentation

## 2014-11-26 MED ORDER — GUAIFENESIN-CODEINE 100-10 MG/5ML PO SOLN: 5.0000 mL | Freq: Four times a day (QID) | ORAL | Status: DC | PRN

## 2014-11-26 MED ORDER — ALBUTEROL SULFATE HFA 108 (90 BASE) MCG/ACT IN AERS
2.0000 | INHALATION_SPRAY | Freq: Once | RESPIRATORY_TRACT | Status: AC
  Administered 2014-11-26: 2 via RESPIRATORY_TRACT
  Filled 2014-11-26: qty 6.7

## 2014-11-26 MED ORDER — PREDNISONE 20 MG PO TABS: 40.0000 mg | ORAL_TABLET | Freq: Every day | ORAL | Status: AC

## 2014-11-26 MED ORDER — IPRATROPIUM-ALBUTEROL 0.5-2.5 (3) MG/3ML IN SOLN
3.0000 mL | Freq: Once | RESPIRATORY_TRACT | Status: AC
  Administered 2014-11-26: 3 mL via RESPIRATORY_TRACT
  Filled 2014-11-26: qty 3

## 2014-11-26 MED ORDER — AZITHROMYCIN 250 MG PO TABS: ORAL_TABLET | ORAL | Status: DC

## 2014-11-26 MED ORDER — DEXAMETHASONE SODIUM PHOSPHATE 10 MG/ML IJ SOLN
10.0000 mg | Freq: Once | INTRAMUSCULAR | Status: AC
  Administered 2014-11-26: 10 mg via INTRAMUSCULAR
  Filled 2014-11-26: qty 1

## 2014-11-26 NOTE — ED Notes (Signed)
Discharge instructions given to pt , verbalized understanding, Discussed how to take Z pack. No other questions, declined offer of work note

## 2014-11-26 NOTE — ED Notes (Signed)
Started with cough on last Saturday, productive (brow/green).  Having SOB.

## 2014-11-26 NOTE — ED Provider Notes (Signed)
CSN: [id number]     Arrival date & time 11/26/14  1334 History   First MD Initiated Contact with Patient 11/26/14 1516     Chief Complaint  Patient presents with  . Cough  . Night Sweats  . Shortness of Breath     (Consider location/radiation/quality/duration/timing/severity/associated sxs/prior Treatment) Patient is a 34 y.o. male presenting with cough and shortness of breath. The history is provided by the patient. No language interpreter was used.  Cough Cough characteristics:  Productive Sputum characteristics:  Nondescript Severity:  Severe Onset quality:  Gradual Duration:  1 week Timing:  Constant Progression:  Worsening Chronicity:  New Smoker: yes   Context: upper respiratory infection   Context: not animal exposure, not exposure to allergens, not fumes, not occupational exposure, not sick contacts, not smoke exposure, not weather changes and not with activity   Relieved by:  Nothing Ineffective treatments:  Cough suppressants Associated symptoms: chest pain (with cough), chills, headaches, myalgias, shortness of breath and wheezing   Associated symptoms: no ear fullness, no ear pain, no eye discharge, no fever, no rash, no rhinorrhea, no sinus congestion, no sore throat and no weight loss  Diaphoresis: night sweats.   Shortness of Breath Associated symptoms: chest pain (with cough), cough, headaches and wheezing   Associated symptoms: no ear pain, no fever, no rash and no sore throat  Diaphoresis: night sweats.     History reviewed. No pertinent past medical history. Past Surgical History  Procedure Laterality Date  . Hand surgery     Family History  Problem Relation Age of Onset  . Diabetes Mother   . Diabetes Father    Social History  Substance Use Topics  . Smoking status: Current Every Day Smoker -- 1.00 packs/day for 12 years    Types: Cigarettes  . Smokeless tobacco: Never Used  . Alcohol Use: No    Review of Systems  Constitutional: Positive for  chills. Negative for fever and weight loss. Diaphoresis: night sweats.  HENT: Negative for ear pain, rhinorrhea and sore throat.   Eyes: Negative for discharge.  Respiratory: Positive for cough, shortness of breath and wheezing.   Cardiovascular: Positive for chest pain (with cough).  Musculoskeletal: Positive for myalgias.  Skin: Negative for rash.  Neurological: Positive for headaches.  All other systems reviewed and are negative.     Allergies  Review of patient's allergies indicates no known allergies.  Home Medications   Prior to Admission medications   Medication Sig Start Date End Date Taking? Authorizing Provider  amoxicillin (AMOXIL) 500 MG capsule Take 2 capsules (1,000 mg total) by mouth 2 (two) times daily. Patient not taking: Reported on 04/24/2014 01/17/14   Dione Booze, MD  dexamethasone (DECADRON) 4 MG tablet Take 1 tablet (4 mg total) by mouth 2 (two) times daily with a meal. Patient not taking: Reported on 05/23/2014 04/24/14   Ivery Quale, PA-C  dexamethasone (DECADRON) 4 MG tablet Take 1 tablet (4 mg total) by mouth 2 (two) times daily with a meal. Patient not taking: Reported on 05/23/2014 04/24/14   Ivery Quale, PA-C  diclofenac (VOLTAREN) 75 MG EC tablet Take 1 tablet (75 mg total) by mouth 2 (two) times daily. Patient not taking: Reported on 05/23/2014 04/24/14   Ivery Quale, PA-C  HYDROcodone-acetaminophen (NORCO/VICODIN) 5-325 MG per tablet Take 1 tablet by mouth every 6 (six) hours as needed. 05/23/14   Bethann Berkshire, MD  naproxen (NAPROSYN) 500 MG tablet Take 1 tablet (500 mg total) by mouth 2 (two) times  daily. 05/23/14   Bethann BerkshireJoseph Zammit, MD  oxyCODONE-acetaminophen (PERCOCET) 5-325 MG per tablet Take 1 tablet by mouth every 4 (four) hours as needed for moderate pain. Patient not taking: Reported on 04/24/2014 01/17/14   Dione Boozeavid Glick, MD   BP 137/76 mmHg  Pulse 85  Temp(Src) 98.1 F (36.7 C) (Oral)  Resp 20  Ht 6\' 3"  (1.905 m)  Wt 245 lb (111.131 kg)  BMI 30.62  kg/m2  SpO2 99% Physical Exam  Constitutional: He appears well-developed and well-nourished. No distress.  HENT:  Head: Normocephalic and atraumatic.  Eyes: Conjunctivae are normal. No scleral icterus.  Neck: Normal range of motion. Neck supple.  Cardiovascular: Normal rate, regular rhythm and normal heart sounds.   Pulmonary/Chest: Effort normal. No respiratory distress. He has wheezes.  Diffuse expiratory and inspiratory wheezes.  Abdominal: Soft. There is no tenderness.  Musculoskeletal: He exhibits no edema.  Neurological: He is alert.  Skin: Skin is warm and dry. He is not diaphoretic.  Psychiatric: His behavior is normal.  Nursing note and vitals reviewed.   ED Course  Procedures (including critical care time) Labs Review Labs Reviewed - No data to display  Imaging Review Dg Chest 2 View  11/26/2014  CLINICAL DATA:  Productive cough since Saturday, now shortness of breath. EXAM: CHEST  2 VIEW COMPARISON:  Chest x-ray dated 05/23/2014. FINDINGS: Heart size is normal. Overall cardiomediastinal silhouette is stable in size and configuration. Lungs are clear. Lung volumes are normal. No pleural effusion. No pneumothorax. No significant osseous abnormality. IMPRESSION: Stable chest x-ray. No evidence of acute cardiopulmonary abnormality. No evidence of pneumonia seen. Electronically Signed   By: Bary RichardStan  Maynard M.D.   On: 11/26/2014 14:02   I have personally reviewed and evaluated these images and lab results as part of my medical decision-making.   EKG Interpretation None      MDM   Final diagnoses:  Bronchitis  RAD (reactive airway disease) with wheezing, mild intermittent, with acute exacerbation  Acute URI of multiple sites    Patient CXR negative. Diffuse wheezing. Will treat the patient with decadron and duoneb. Reevaluate.  Patient improved.  Will dc with albuterol, zpak, prednisone, and cough syrup. The patient was counseled on the dangers of tobacco use, and  was advised to quit.  Reviewed strategies to maximize success, including removing cigarettes and smoking materials from environment, stress management, substitution of other forms of reinforcement and support of family/friends.   Arthor Captainbigail Halli Equihua, PA-C 11/26/14 1647  Mancel BaleElliott Wentz, MD 11/27/14 1534

## 2014-11-26 NOTE — Discharge Instructions (Signed)
Cough, Adult Coughing is a reflex that clears your throat and your airways. Coughing helps to heal and protect your lungs. It is normal to cough occasionally, but a cough that happens with other symptoms or lasts a long time may be a sign of a condition that needs treatment. A cough may last only 2-3 weeks (acute), or it may last longer than 8 weeks (chronic). CAUSES Coughing is commonly caused by: 1. Breathing in substances that irritate your lungs. 2. A viral or bacterial respiratory infection. 3. Allergies. 4. Asthma. 5. Postnasal drip. 6. Smoking. 7. Acid backing up from the stomach into the esophagus (gastroesophageal reflux). 8. Certain medicines. 9. Chronic lung problems, including COPD (or rarely, lung cancer). 10. Other medical conditions such as heart failure. HOME CARE INSTRUCTIONS  Pay attention to any changes in your symptoms. Take these actions to help with your discomfort:  Take medicines only as told by your health care provider.  If you were prescribed an antibiotic medicine, take it as told by your health care provider. Do not stop taking the antibiotic even if you start to feel better.  Talk with your health care provider before you take a cough suppressant medicine.  Drink enough fluid to keep your urine clear or pale yellow.  If the air is dry, use a cold steam vaporizer or humidifier in your bedroom or your home to help loosen secretions.  Avoid anything that causes you to cough at work or at home.  If your cough is worse at night, try sleeping in a semi-upright position.  Avoid cigarette smoke. If you smoke, quit smoking. If you need help quitting, ask your health care provider.  Avoid caffeine.  Avoid alcohol.  Rest as needed. SEEK MEDICAL CARE IF:   You have new symptoms.  You cough up pus.  Your cough does not get better after 2-3 weeks, or your cough gets worse.  You cannot control your cough with suppressant medicines and you are losing  sleep.  You develop pain that is getting worse or pain that is not controlled with pain medicines.  You have a fever.  You have unexplained weight loss.  You have night sweats. SEEK IMMEDIATE MEDICAL CARE IF:  You cough up blood.  You have difficulty breathing.  Your heartbeat is very fast.   This information is not intended to replace advice given to you by your health care provider. Make sure you discuss any questions you have with your health care provider.   Document Released: 06/29/2010 Document Revised: 09/21/2014 Document Reviewed: 03/09/2014 Elsevier Interactive Patient Education 2016 [employer]  Asthma, Adult Asthma is a recurring condition in which the airways tighten and narrow. Asthma can make it difficult to breathe. It can cause coughing, wheezing, and shortness of breath. Asthma episodes, also called asthma attacks, range from minor to life-threatening. Asthma cannot be cured, but medicines and lifestyle changes can help control it. CAUSES Asthma is believed to be caused by inherited (genetic) and environmental factors, but its exact cause is unknown. Asthma may be triggered by allergens, lung infections, or irritants in the air. Asthma triggers are different for each person. Common triggers include:  11. Animal dander. 12. Dust mites. 13. Cockroaches. 14. Pollen from trees or grass. 15. Mold. 16. Smoke. 17. Air pollutants such as dust, household cleaners, hair sprays, aerosol sprays, paint fumes, strong chemicals, or strong odors. 18. Cold air, weather changes, and winds (which increase molds and pollens in the air). 19. Strong emotional expressions such as  crying or laughing hard. 20. Stress. 21. Certain medicines (such as aspirin) or types of drugs (such as beta-blockers). 22. Sulfites in foods and drinks. Foods and drinks that may contain sulfites include dried fruit, potato chips, and sparkling grape juice. 23. Infections or inflammatory conditions such  as the flu, a cold, or an inflammation of the nasal membranes (rhinitis). 24. Gastroesophageal reflux disease (GERD). 25. Exercise or strenuous activity. SYMPTOMS Symptoms may occur immediately after asthma is triggered or many hours later. Symptoms include:  Wheezing.  Excessive nighttime or early morning coughing.  Frequent or severe coughing with a common cold.  Chest tightness.  Shortness of breath. DIAGNOSIS  The diagnosis of asthma is made by a review of your medical history and a physical exam. Tests may also be performed. These may include:  Lung function studies. These tests show how much air you breathe in and out.  Allergy tests.  Imaging tests such as X-rays. TREATMENT  Asthma cannot be cured, but it can usually be controlled. Treatment involves identifying and avoiding your asthma triggers. It also involves medicines. There are 2 classes of medicine used for asthma treatment:   Controller medicines. These prevent asthma symptoms from occurring. They are usually taken every day.  Reliever or rescue medicines. These quickly relieve asthma symptoms. They are used as needed and provide short-term relief. Your health care provider will help you create an asthma action plan. An asthma action plan is a written plan for managing and treating your asthma attacks. It includes a list of your asthma triggers and how they may be avoided. It also includes information on when medicines should be taken and when their dosage should be changed. An action plan may also involve the use of a device called a peak flow meter. A peak flow meter measures how well the lungs are working. It helps you monitor your condition. HOME CARE INSTRUCTIONS   Take medicines only as directed by your health care provider. Speak with your health care provider if you have questions about how or when to take the medicines.  Use a peak flow meter as directed by your health care provider. Record and keep track of  readings.  Understand and use the action plan to help minimize or stop an asthma attack without needing to seek medical care.  Control your home environment in the following ways to help prevent asthma attacks:  Do not smoke. Avoid being exposed to secondhand smoke.  Change your heating and air conditioning filter regularly.  Limit your use of fireplaces and wood stoves.  Get rid of pests (such as roaches and mice) and their droppings.  Throw away plants if you see mold on them.  Clean your floors and dust regularly. Use unscented cleaning products.  Try to have someone else vacuum for you regularly. Stay out of rooms while they are being vacuumed and for a short while afterward. If you vacuum, use a dust mask from a hardware store, a double-layered or microfilter vacuum cleaner bag, or a vacuum cleaner with a HEPA filter.  Replace carpet with wood, tile, or vinyl flooring. Carpet can trap dander and dust.  Use allergy-proof pillows, mattress covers, and box spring covers.  Wash bed sheets and blankets every week in hot water and dry them in a dryer.  Use blankets that are made of polyester or cotton.  Clean bathrooms and kitchens with bleach. If possible, have someone repaint the walls in these rooms with mold-resistant paint. Keep out of the rooms  that are being cleaned and painted.  Wash hands frequently. SEEK MEDICAL CARE IF:   You have wheezing, shortness of breath, or a cough even if taking medicine to prevent attacks.  The colored mucus you cough up (sputum) is thicker than usual.  Your sputum changes from clear or white to yellow, green, gray, or bloody.  You have any problems that may be related to the medicines you are taking (such as a rash, itching, swelling, or trouble breathing).  You are using a reliever medicine more than 2-3 times per week.  Your peak flow is still at 50-79% of your personal best after following your action plan for 1 hour.  You have a  fever. SEEK IMMEDIATE MEDICAL CARE IF:   You seem to be getting worse and are unresponsive to treatment during an asthma attack.  You are short of breath even at rest.  You get short of breath when doing very little physical activity.  You have difficulty eating, drinking, or talking due to asthma symptoms.  You develop chest pain.  You develop a fast heartbeat.  You have a bluish color to your lips or fingernails.  You are light-headed, dizzy, or faint.  Your peak flow is less than 50% of your personal best.   This information is not intended to replace advice given to you by your health care provider. Make sure you discuss any questions you have with your health care provider.   Document Released: 12/31/2004 Document Revised: 09/21/2014 Document Reviewed: 07/30/2012 Elsevier Interactive Patient Education 2016 [employer] Inc.  Metered Dose Inhaler (No Spacer Used) Inhaled medicines are the basis of treatment for asthma and other breathing problems. Inhaled medicine can only be effective if used properly. Good technique assures that the medicine reaches the lungs. Metered dose inhalers (MDIs) are used to deliver a variety of inhaled medicines. These include quick relief or rescue medicines (such as bronchodilators) and controller medicines (such as corticosteroids). The medicine is delivered by pushing down on a metal canister to release a set amount of spray. If you are using different kinds of inhalers, use your quick relief medicine to open the airways 10-15 minutes before using a steroid, if instructed to do so by your health care provider. If you are unsure which inhalers to use and the order of using them, ask your health care provider, nurse, or respiratory therapist. HOW TO USE THE INHALER 26. Remove the cap from the inhaler. 27. If you are using the inhaler for the first time, you will need to prime it. Shake the inhaler for 5 seconds and release four puffs into the air, away  from your face. Ask your health care provider or pharmacist if you have questions about priming your inhaler. 28. Shake the inhaler for 5 seconds before each breath in (inhalation). 29. Position the inhaler so that the top of the canister faces up. 30. Put your index finger on the top of the medicine canister. Your thumb supports the bottom of the inhaler. 31. Open your mouth. 32. Either place the inhaler between your teeth and place your lips tightly around the mouthpiece, or hold the inhaler 1-2 inches away from your open mouth. If you are unsure of which technique to use, ask your health care provider. 33. Breathe out (exhale) normally and as completely as possible. 34. Press the canister down with the index finger to release the medicine. 35. At the same time as the canister is pressed, inhale deeply and slowly until your lungs are  completely filled. This should take 4-6 seconds. Keep your tongue down. 36. Hold the medicine in your lungs for 5-10 seconds (10 seconds is best). This helps the medicine get into the small airways of your lungs. 37. Breathe out slowly, through pursed lips. Whistling is an example of pursed lips. 38. Wait at least 1 minute between puffs. Continue with the above steps until you have taken the number of puffs your health care provider has ordered. Do not use the inhaler more than your health care provider directs you to. 39. Replace the cap on the inhaler. 40. Follow the directions from your health care provider or the inhaler insert for cleaning the inhaler. If you are using a steroid inhaler, after your last puff, rinse your mouth with water, gargle, and spit out the water. Do not swallow the water. AVOID:  Inhaling before or after starting the spray of medicine. It takes practice to coordinate your breathing with triggering the spray.  Inhaling through the nose (rather than the mouth) when triggering the spray. HOW TO DETERMINE IF YOUR INHALER IS FULL OR NEARLY  EMPTY You cannot know when an inhaler is empty by shaking it. Some inhalers are now being made with dose counters. Ask your health care provider for a prescription that has a dose counter if you feel you need that extra help. If your inhaler does not have a counter, ask your health care provider to help you determine the date you need to refill your inhaler. Write the refill date on a calendar or your inhaler canister. Refill your inhaler 7-10 days before it runs out. Be sure to keep an adequate supply of medicine. This includes making sure it has not expired, and making sure you have a spare inhaler. SEEK MEDICAL CARE IF:  Symptoms are only partially relieved with your inhaler.  You are having trouble using your inhaler.  You experience an increase in phlegm. SEEK IMMEDIATE MEDICAL CARE IF:  You feel little or no relief with your inhalers. You are still wheezing and feeling shortness of breath, tightness in your chest, or both.  You have dizziness, headaches, or a fast heart rate.  You have chills, fever, or night sweats.  There is a noticeable increase in phlegm production, or there is blood in the phlegm. MAKE SURE YOU:  Understand these instructions.  Will watch your condition.  Will get help right away if you are not doing well or get worse.   This information is not intended to replace advice given to you by your health care provider. Make sure you discuss any questions you have with your health care provider.   Document Released: 10/28/2006 Document Revised: 01/21/2014 Document Reviewed: 06/18/2012 Elsevier Interactive Patient Education [employer]2016 [employer] Inc.

## 2015-08-19 ENCOUNTER — Encounter (HOSPITAL_COMMUNITY): Payer: Self-pay | Admitting: Emergency Medicine

## 2015-08-19 ENCOUNTER — Emergency Department (HOSPITAL_COMMUNITY)
Admission: EM | Admit: 2015-08-19 | Discharge: 2015-08-19 | Disposition: A | Discharge status: Home / Self Care | Payer: Self-pay | Attending: Emergency Medicine | Admitting: Emergency Medicine

## 2015-08-19 DIAGNOSIS — K029 Dental caries, unspecified: Secondary | ICD-10-CM | POA: Insufficient documentation

## 2015-08-19 DIAGNOSIS — F17210 Nicotine dependence, cigarettes, uncomplicated: Secondary | ICD-10-CM | POA: Insufficient documentation

## 2015-08-19 DIAGNOSIS — K0889 Other specified disorders of teeth and supporting structures: Secondary | ICD-10-CM

## 2015-08-19 MED ORDER — PENICILLIN V POTASSIUM 500 MG PO TABS: 500.0000 mg | ORAL_TABLET | Freq: Four times a day (QID) | ORAL | 0 refills | Status: AC

## 2015-08-19 NOTE — ED Triage Notes (Signed)
Pt c/o swelling to left side of face, has cavities on top teeth

## 2015-08-19 NOTE — Discharge Instructions (Signed)
You have a dental injury. It is very important that you get evaluated by a dentist as soon as possible. Call tomorrow to schedule an appointment. Ibuprofen as needed for pain. Take your full course of antibiotics. Read the instructions below. ° °Eat a soft or liquid diet and rinse your mouth out after meals with warm water. You should see a dentist or return here at once if you have increased swelling, increased pain or uncontrolled bleeding from the site of your injury. ° °SEEK MEDICAL CARE IF:  °You have increased pain not controlled with medicines.  °You have swelling around your tooth, in your face or neck.  °You have bleeding which starts, continues, or gets worse.  °You have a fever >101 °If you are unable to open your mouth °

## 2015-08-19 NOTE — ED Provider Notes (Signed)
[facility]-EMERGENCY DEPT Provider Note   CSN: [id number] Arrival date & time: 08/19/15  1030  By signing my name below, I, Phillis Haggis, attest that this documentation has been prepared under the direction and in the presence of [facility], PA-C. Electronically Signed: Phillis Haggis, ED Scribe. 08/19/15. 11:08 AM.  First Provider Contact:  First MD Initiated Contact with Patient 08/19/15 1051      History   Chief Complaint Chief Complaint  Patient presents with  . Dental Pain   The history is provided by the patient. No language interpreter was used.  HPI Comments: Andrew Mueller is a 35 y.o. male who presents to the Emergency Department complaining of worsening left upper dental pain onset one day ago. He reports associated facial swelling to the area. He has not tried anything for his symptoms. He reports hx of similar symptoms. He denies fever, chills, nausea, vomiting, SOB, or trouble swallowing. Pt does not have a dentist. He denies allergies to antibiotics.    History reviewed. No pertinent past medical history.  There are no active problems to display for this patient.   Past Surgical History:  Procedure Laterality Date  . HAND SURGERY       Home Medications    Prior to Admission medications   Medication Sig Start Date End Date Taking? Authorizing Provider  amoxicillin (AMOXIL) 500 MG capsule Take 2 capsules (1,000 mg total) by mouth 2 (two) times daily. Patient not taking: Reported on 04/24/2014 01/17/14   Dione Booze, MD  azithromycin (ZITHROMAX Z-PAK) 250 MG tablet 2 po day one, then 1 daily x 4 days 11/26/14   Arthor Captain, PA-C  dexamethasone (DECADRON) 4 MG tablet Take 1 tablet (4 mg total) by mouth 2 (two) times daily with a meal. Patient not taking: Reported on 05/23/2014 04/24/14   Ivery Quale, PA-C  dexamethasone (DECADRON) 4 MG tablet Take 1 tablet (4 mg total) by mouth 2 (two) times daily with a meal. Patient not taking: Reported on 05/23/2014 04/24/14   Ivery Quale, PA-C  diclofenac (VOLTAREN) 75 MG EC tablet Take 1 tablet (75 mg total) by mouth 2 (two) times daily. Patient not taking: Reported on 05/23/2014 04/24/14   Ivery Quale, PA-C  guaiFENesin-codeine 100-10 MG/5ML syrup Take 5-10 mLs by mouth every 6 (six) hours as needed for cough. 11/26/14   Arthor Captain, PA-C  HYDROcodone-acetaminophen (NORCO/VICODIN) 5-325 MG per tablet Take 1 tablet by mouth every 6 (six) hours as needed. 05/23/14   Bethann Berkshire, MD  naproxen (NAPROSYN) 500 MG tablet Take 1 tablet (500 mg total) by mouth 2 (two) times daily. 05/23/14   Bethann Berkshire, MD  oxyCODONE-acetaminophen (PERCOCET) 5-325 MG per tablet Take 1 tablet by mouth every 4 (four) hours as needed for moderate pain. Patient not taking: Reported on 04/24/2014 01/17/14   Dione Booze, MD  penicillin v potassium (VEETID) 500 MG tablet Take 1 tablet (500 mg total) by mouth 4 (four) times daily. 08/19/15 08/26/15  Chase Picket Elmer Boutelle, PA-C    Family History Family History  Problem Relation Age of Onset  . Diabetes Mother   . Diabetes Father     Social History Social History  Substance Use Topics  . Smoking status: Current Every Day Smoker    Packs/day: 1.00    Years: 12.00    Types: Cigarettes  . Smokeless tobacco: Never Used  . Alcohol use No     Allergies   Review of patient's allergies indicates no known allergies.   Review of Systems  Review of Systems  Constitutional: Negative for chills and fever.  HENT: Positive for dental problem and facial swelling. Negative for trouble swallowing.   Respiratory: Negative for shortness of breath.   Gastrointestinal: Negative for nausea and vomiting.   Physical Exam Updated Vital Signs BP 142/93 (BP Location: Left Arm)   Pulse 78   Temp 97.9 F (36.6 C) (Oral)   Resp 18   SpO2 96%   Physical Exam  Constitutional: He is oriented to person, place, and time. He appears well-developed and well-nourished. No distress.  HENT:  Head: Normocephalic and  atraumatic.  Dental cavities and poor oral dentition noted, pain along left central incisor. midline uvula, no trismus, oropharynx moist and clear, no abscess noted, no oropharyngeal erythema or edema, neck supple and no tenderness. + left sided facial swelling.   Cardiovascular: Normal rate, regular rhythm, normal heart sounds and intact distal pulses.  Exam reveals no gallop and no friction rub.   No murmur heard. Pulmonary/Chest: Effort normal and breath sounds normal. No respiratory distress. He has no wheezes. He has no rales. He exhibits no tenderness.  Abdominal: Soft. Bowel sounds are normal. He exhibits no distension. There is no tenderness.  Musculoskeletal: He exhibits no edema.  Lymphadenopathy:    He has no cervical adenopathy.  Neurological: He is alert and oriented to person, place, and time.  Skin: Skin is warm and dry.  Nursing note and vitals reviewed.  ED Treatments / Results  DIAGNOSTIC STUDIES: Oxygen Saturation is 96% on RA, normal by my interpretation.    COORDINATION OF CARE: 11:06 AM-Discussed treatment plan which includes anti-biotics with pt at bedside and pt agreed to plan.    Labs (all labs ordered are listed, but only abnormal results are displayed) Labs Reviewed - No data to display  EKG  EKG Interpretation None       Radiology No results found.  Procedures Procedures (including critical care time)  Medications Ordered in ED Medications - No data to display   Initial Impression / Assessment and Plan / ED Course  I have reviewed the triage vital signs and the nursing notes.  Pertinent labs & imaging results that were available during my care of the patient were reviewed by me and considered in my medical decision making (see chart for details).  Clinical Course   Patient with dentalgia. No abscess requiring immediate incision and drainage. Patient is afebrile, non toxic appearing, and swallowing secretions well. Exam not concerning for  Ludwig's angina or pharyngeal abscess. Will treat with PenVK. I provided dental resource guide and stressed the importance of dental follow up for ultimate management of dental pain. Patient voices understanding and is agreeable to plan.  Final Clinical Impressions(s) / ED Diagnoses   Final diagnoses:  Dentalgia  I personally performed the services described in this documentation, which was scribed in my presence. The recorded information has been reviewed and is accurate.   New Prescriptions New Prescriptions   PENICILLIN V POTASSIUM (VEETID) 500 MG TABLET    Take 1 tablet (500 mg total) by mouth 4 (four) times daily.     [facility] Philipp Callegari, PA-C 08/19/15 1115    Arby Barrette, MD 08/20/15 [phone]

## 2017-05-31 ENCOUNTER — Emergency Department (HOSPITAL_COMMUNITY): Payer: Self-pay

## 2017-05-31 ENCOUNTER — Encounter (HOSPITAL_COMMUNITY): Payer: Self-pay | Admitting: Emergency Medicine

## 2017-05-31 ENCOUNTER — Emergency Department (HOSPITAL_COMMUNITY)
Admission: EM | Admit: 2017-05-31 | Discharge: 2017-05-31 | Disposition: A | Discharge status: Home / Self Care | Payer: Self-pay | Attending: Emergency Medicine | Admitting: Emergency Medicine

## 2017-05-31 DIAGNOSIS — Z720 Tobacco use: Secondary | ICD-10-CM

## 2017-05-31 DIAGNOSIS — J90 Pleural effusion, not elsewhere classified: Secondary | ICD-10-CM | POA: Insufficient documentation

## 2017-05-31 DIAGNOSIS — F17210 Nicotine dependence, cigarettes, uncomplicated: Secondary | ICD-10-CM | POA: Insufficient documentation

## 2017-05-31 LAB — CBC WITH DIFFERENTIAL/PLATELET
ABS IMMATURE GRANULOCYTES: 0 10*3/uL (ref 0.0–0.1)
Basophils Absolute: 0.1 10*3/uL (ref 0.0–0.1)
Basophils Relative: 1 %
Eosinophils Absolute: 0.4 10*3/uL (ref 0.0–0.7)
Eosinophils Relative: 5 %
HEMATOCRIT: 45.4 % (ref 39.0–52.0)
Hemoglobin: 15.3 g/dL (ref 13.0–17.0)
Immature Granulocytes: 0 %
Lymphocytes Relative: 29 %
Lymphs Abs: 2.3 10*3/uL (ref 0.7–4.0)
MCH: 31.5 pg (ref 26.0–34.0)
MCHC: 33.7 g/dL (ref 30.0–36.0)
MCV: 93.6 fL (ref 78.0–100.0)
Monocytes Absolute: 0.8 10*3/uL (ref 0.1–1.0)
Monocytes Relative: 10 %
NEUTROS ABS: 4.3 10*3/uL (ref 1.7–7.7)
Neutrophils Relative %: 55 %
Platelets: 254 10*3/uL (ref 150–400)
RBC: 4.85 MIL/uL (ref 4.22–5.81)
RDW: 11.8 % (ref 11.5–15.5)
WBC: 7.9 10*3/uL (ref 4.0–10.5)

## 2017-05-31 LAB — COMPREHENSIVE METABOLIC PANEL
ALT: 23 U/L (ref 17–63)
AST: 34 U/L (ref 15–41)
Albumin: 4.1 g/dL (ref 3.5–5.0)
Alkaline Phosphatase: 52 U/L (ref 38–126)
Anion gap: 8 (ref 5–15)
BUN: 11 mg/dL (ref 6–20)
CO2: 25 mmol/L (ref 22–32)
Calcium: 8.6 mg/dL — ABNORMAL LOW (ref 8.9–10.3)
Chloride: 98 mmol/L — ABNORMAL LOW (ref 101–111)
Creatinine, Ser: 0.9 mg/dL (ref 0.61–1.24)
GFR calc Af Amer: >60 mL/min (ref >60)
GFR calc non Af Amer: >60 mL/min (ref >60)
GLUCOSE: 100 mg/dL — AB (ref 65–99)
Potassium: 5.7 mmol/L — ABNORMAL HIGH (ref 3.5–5.1)
Sodium: 131 mmol/L — ABNORMAL LOW (ref 135–145)
Total Bilirubin: 1.1 mg/dL (ref 0.3–1.2)
Total Protein: 8 g/dL (ref 6.5–8.1)

## 2017-05-31 LAB — I-STAT CHEM 8, ED
BUN: 15 mg/dL (ref 6–20)
CHLORIDE: 100 mmol/L — AB (ref 101–111)
Calcium, Ion: 1.02 mmol/L — ABNORMAL LOW (ref 1.15–1.40)
Creatinine, Ser: 0.8 mg/dL (ref 0.61–1.24)
GLUCOSE: 102 mg/dL — AB (ref 65–99)
HCT: 46 % (ref 39.0–52.0)
Hemoglobin: 15.6 g/dL (ref 13.0–17.0)
POTASSIUM: 5.2 mmol/L — AB (ref 3.5–5.1)
Sodium: 135 mmol/L (ref 135–145)
TCO2: 27 mmol/L (ref 22–32)

## 2017-05-31 LAB — BASIC METABOLIC PANEL
Anion gap: 8 (ref 5–15)
BUN: 10 mg/dL (ref 6–20)
CHLORIDE: 96 mmol/L — AB (ref 101–111)
CO2: 27 mmol/L (ref 22–32)
Calcium: 8.4 mg/dL — ABNORMAL LOW (ref 8.9–10.3)
Creatinine, Ser: 0.9 mg/dL (ref 0.61–1.24)
GFR calc non Af Amer: >60 mL/min (ref >60)
Glucose, Bld: 123 mg/dL — ABNORMAL HIGH (ref 65–99)
POTASSIUM: 3.9 mmol/L (ref 3.5–5.1)
SODIUM: 131 mmol/L — AB (ref 135–145)

## 2017-05-31 LAB — D-DIMER, QUANTITATIVE (NOT AT ARMC): D DIMER QUANT: 2.46 ug{FEU}/mL — AB (ref 0.00–0.50)

## 2017-05-31 MED ORDER — OXYCODONE-ACETAMINOPHEN 5-325 MG PO TABS: 1.0000 | ORAL_TABLET | ORAL | 0 refills | Status: DC | PRN

## 2017-05-31 MED ORDER — MELOXICAM 15 MG PO TABS: 15.0000 mg | ORAL_TABLET | Freq: Every day | ORAL | 0 refills | Status: DC

## 2017-05-31 MED ORDER — PREDNISONE 10 MG (21) PO TBPK: ORAL_TABLET | ORAL | 0 refills | Status: DC

## 2017-05-31 MED ORDER — IOPAMIDOL (ISOVUE-370) INJECTION 76%
INTRAVENOUS | Status: AC
  Administered 2017-05-31: 100 mL
  Filled 2017-05-31: qty 100

## 2017-05-31 MED ORDER — OXYCODONE-ACETAMINOPHEN 5-325 MG PO TABS
1.0000 | ORAL_TABLET | ORAL | Status: DC | PRN
  Administered 2017-05-31: 1 via ORAL
  Filled 2017-05-31: qty 1

## 2017-05-31 MED ORDER — KETOROLAC TROMETHAMINE 30 MG/ML IJ SOLN
30.0000 mg | Freq: Once | INTRAMUSCULAR | Status: AC
Start: 2017-05-31 — End: 2017-05-31
  Administered 2017-05-31: 30 mg via INTRAVENOUS
  Filled 2017-05-31: qty 1

## 2017-05-31 MED ORDER — ONDANSETRON HCL 4 MG/2ML IJ SOLN
4.0000 mg | Freq: Once | INTRAMUSCULAR | Status: AC
  Administered 2017-05-31: 4 mg via INTRAVENOUS
  Filled 2017-05-31: qty 2

## 2017-05-31 MED ORDER — HYDROMORPHONE HCL 2 MG/ML IJ SOLN
0.5000 mg | Freq: Once | INTRAMUSCULAR | Status: AC
  Administered 2017-05-31: 0.5 mg via INTRAVENOUS
  Filled 2017-05-31: qty 1

## 2017-05-31 MED ORDER — DEXAMETHASONE SODIUM PHOSPHATE 10 MG/ML IJ SOLN
10.0000 mg | Freq: Once | INTRAMUSCULAR | Status: AC
  Administered 2017-05-31: 10 mg via INTRAVENOUS
  Filled 2017-05-31: qty 1

## 2017-05-31 MED ORDER — FENTANYL CITRATE (PF) 100 MCG/2ML IJ SOLN
50.0000 ug | INTRAMUSCULAR | Status: DC | PRN
  Filled 2017-05-31: qty 2

## 2017-05-31 NOTE — ED Notes (Signed)
Patient able to ambulate independently. Picked up by wife

## 2017-05-31 NOTE — ED Notes (Signed)
Pt updated on wait time for CT 

## 2017-05-31 NOTE — ED Triage Notes (Signed)
Reports pain in right rib cage that started two weeks ago. Reports it went away for a day or two but has gotten worse at night time.  Denies having any injury.

## 2017-05-31 NOTE — ED Notes (Signed)
No answer from pt in waiting room 

## 2017-05-31 NOTE — Discharge Instructions (Addendum)
Please read the attached pamphlets about your diagnosis. DO NOT SMOKE  OR BE AROUND SMOKE DURING THIS INFECTION. Contact a health care provider if: You have pain that: Gets worse. Does not get better with medicine. Lasts for more than 1 week. You have a fever or chills. Your cough or shortness of breath is not improving at home. You cough up pus-like (purulent) secretions. Get help right away if: Your lips, fingernails, or toenails darken or turn blue. You cough up blood. You have any of the following symptoms that get worse: Difficulty breathing. Shortness of breath. Wheezing. You have pain that spreads into your neck, arms, or jaw. You develop a rash. You vomit. You faint.

## 2017-05-31 NOTE — ED Notes (Signed)
This RN called IV team to get status and rn stated "I will be there as son as I can"

## 2017-05-31 NOTE — ED Notes (Signed)
This RN wasted 1.5 of dilaudid with Madalyn Rob from previous admnistration

## 2017-05-31 NOTE — ED Provider Notes (Signed)
[facility] EMERGENCY DEPARTMENT Provider Note   CSN: [id number] Arrival date & time: 05/31/17  0510     History   Chief Complaint Chief Complaint  Patient presents with  . right ribcage pain    [name] is a 37 y.o. male who presents emergency.  Patient is a chronic daily smoker.  Patient states that he developed right rib cage pain about 3 weeks ago that went away and then returned and has been persistent for the past 2 weeks.  He describes the pain as sharp and pleuritic, severe and unchanged.  He did not denies hemoptysis or unilateral leg swelling but does complain of cough that has been going on.  He denies fevers, chills, body aches, unexplained weight loss or soaking night sweats.  He denies rashes or other eruptions on his rib cage and denies injury.  HPI  History reviewed. No pertinent past medical history.  There are no active problems to display for this patient.   Past Surgical History:  Procedure Laterality Date  . HAND SURGERY          Home Medications    Prior to Admission medications   Medication Sig Start Date End Date Taking? Authorizing Provider  amoxicillin (AMOXIL) 500 MG capsule Take 2 capsules (1,000 mg total) by mouth 2 (two) times daily. Patient not taking: Reported on 04/24/2014 01/17/14   Dione Booze, MD  azithromycin (ZITHROMAX Z-PAK) 250 MG tablet 2 po day one, then 1 daily x 4 days 11/26/14   Arthor Captain, PA-C  dexamethasone (DECADRON) 4 MG tablet Take 1 tablet (4 mg total) by mouth 2 (two) times daily with a meal. Patient not taking: Reported on 05/23/2014 04/24/14   Ivery Quale, PA-C  dexamethasone (DECADRON) 4 MG tablet Take 1 tablet (4 mg total) by mouth 2 (two) times daily with a meal. Patient not taking: Reported on 05/23/2014 04/24/14   Ivery Quale, PA-C  diclofenac (VOLTAREN) 75 MG EC tablet Take 1 tablet (75 mg total) by mouth 2 (two) times daily. Patient not taking: Reported on 05/23/2014 04/24/14    Ivery Quale, PA-C  guaiFENesin-codeine 100-10 MG/5ML syrup Take 5-10 mLs by mouth every 6 (six) hours as needed for cough. 11/26/14   Arthor Captain, PA-C  HYDROcodone-acetaminophen (NORCO/VICODIN) 5-325 MG per tablet Take 1 tablet by mouth every 6 (six) hours as needed. 05/23/14   Bethann Berkshire, MD  naproxen (NAPROSYN) 500 MG tablet Take 1 tablet (500 mg total) by mouth 2 (two) times daily. 05/23/14   Bethann Berkshire, MD  oxyCODONE-acetaminophen (PERCOCET) 5-325 MG per tablet Take 1 tablet by mouth every 4 (four) hours as needed for moderate pain. Patient not taking: Reported on 04/24/2014 01/17/14   Dione Booze, MD    Family History Family History  Problem Relation Age of Onset  . Diabetes Mother   . Diabetes Father     Social History Social History   Tobacco Use  . Smoking status: Current Every Day Smoker    Packs/day: 1.00    Years: 12.00    Pack years: 12.00    Types: Cigarettes  . Smokeless tobacco: Never Used  Substance Use Topics  . Alcohol use: No  . Drug use: No     Allergies   Patient has no known allergies.   Review of Systems Review of Systems  Ten systems reviewed and are negative for acute change, except as noted in the HPI.   Physical Exam Updated Vital Signs BP (!) 153/87 (BP Location:  Right Arm)   Pulse 94   Temp 99 F (37.2 C) (Oral)   Resp 18   Ht  (1.905 m)   Wt 117.9 kg (260 lb)   SpO2 96%   BMI 32.50 kg/m   Physical Exam  Constitutional: He appears well-developed and well-nourished. No distress.  Appears uncomfortable  HENT:  Head: Normocephalic and atraumatic.  Eyes: Conjunctivae are normal. No scleral icterus.  Neck: Normal range of motion. Neck supple.  Cardiovascular: Normal rate, regular rhythm and normal heart sounds.  Pulmonary/Chest: No respiratory distress. He has wheezes.  Breathing is guarded, unable to take a deep breath.  No chest wall tenderness.  No evidence of rash or vesicular eruption  Abdominal: Soft. There is  no tenderness.  Musculoskeletal: He exhibits no edema.  Neurological: He is alert.  Skin: Skin is warm and dry. He is not diaphoretic.  Psychiatric: His behavior is normal.  Nursing note and vitals reviewed.    ED Treatments / Results  Labs (all labs ordered are listed, but only abnormal results are displayed) Labs Reviewed - No data to display  EKG None  Radiology Dg Ribs Unilateral W/chest Right  Result Date: 05/31/2017 CLINICAL DATA:  Subacute onset of right rib pain. Initial encounter. EXAM: RIGHT RIBS AND CHEST - 3+ VIEW COMPARISON:  Chest radiograph performed 11/26/2014 FINDINGS: No displaced rib fractures are seen. The lungs are well-aerated. A small right pleural effusion is noted. There is no evidence of pneumothorax. The cardiomediastinal silhouette is within normal limits. No acute osseous abnormalities are seen. IMPRESSION: No displaced rib fracture seen. Small right pleural effusion noted, new from the prior study. Depending on the patient's symptoms and history, CTA of the chest might be considered to help exclude pulmonary embolus. Electronically Signed   By: Roanna Raider M.D.   On: 05/31/2017 06:20    Procedures Procedures (including critical care time)  Medications Ordered in ED Medications  oxyCODONE-acetaminophen (PERCOCET/ROXICET) 5-325 MG per tablet 1 tablet (1 tablet Oral Given 05/31/17 0556)     Initial Impression / Assessment and Plan / ED Course  I have reviewed the triage vital signs and the nursing notes.  Pertinent labs & imaging results that were available during my care of the patient were reviewed by me and considered in my medical decision making (see chart for details).  Clinical Course as of Jun 01 1731  Sat May 31, 2017  4098 Patient here with right-sided pleuritic chest pain.  I have reviewed his EKG which shows normal sinus rhythm and no other abnormalities.  His chest x-ray shows a right-sided small pleural effusion of unknown etiology.   He has no other significant abnormalities on his chest x-ray.  I discussed the case with Dr. Randa Evens of radiology who recommends getting a d-dimer to direct appropriate imaging.  I have ordered basic labs.  Patient offered pain medication.   [AH]  1220 I have ordered CTA  D-Dimer, Quant(!): 2.46 [AH]  1524 I spoke with kim the Lab tech about the patient's potassium of 5.7. She states that looking back, there was some hemolysis, but she is unsure if the potasium would be as elevated as it appears on the lab findings. The results were also auto released so no commentary was place. I will repeat a basic metabolic panel and have discussed the case with Dr. Freida Busman. Patient also made aware.  Potassium(!): 5.7 [AH]  1727 Potassium is normal  Potassium: 3.9 [AH]    Clinical Course User Index [AH] Tiburcio Pea,  Breton Berns, PA-C    Potassium level normal on repeat examination. Patient will be given pain medication, anti-inflammatory and steroids for treatment of pleurisy and effusion. I reviewed his lab work, normal EKG without changes, imaging reviewed and I discussed imaging with radiology.  The patient appears appropriate for discharge at this time, normal oxygen saturations.  Will discharge with incentive spirometry. The patient was counseled on the dangers of tobacco use, and was advised to quit.  Reviewed strategies to maximize success, including removing cigarettes and smoking materials from environment, stress management, substitution of other forms of reinforcement, support of family/friends and written materials.  Final Clinical Impressions(s) / ED Diagnoses   Final diagnoses:  Pleurisy with effusion  Tobacco abuse    ED Discharge Orders    None       Arthor Captain, PA-C 05/31/17 1736    Lorre Nick, MD 06/01/17 [phone]

## 2017-06-11 ENCOUNTER — Other Ambulatory Visit: Payer: Self-pay

## 2017-06-11 ENCOUNTER — Inpatient Hospital Stay (HOSPITAL_COMMUNITY)
Admission: EM | Admit: 2017-06-11 | Discharge: 2017-06-18 | DRG: 163 | Disposition: A | Discharge status: Home / Self Care | Payer: Self-pay | Attending: Internal Medicine | Admitting: Internal Medicine

## 2017-06-11 ENCOUNTER — Encounter (HOSPITAL_COMMUNITY): Payer: Self-pay

## 2017-06-11 ENCOUNTER — Emergency Department (HOSPITAL_COMMUNITY): Payer: Self-pay

## 2017-06-11 DIAGNOSIS — Z4682 Encounter for fitting and adjustment of non-vascular catheter: Secondary | ICD-10-CM

## 2017-06-11 DIAGNOSIS — K0889 Other specified disorders of teeth and supporting structures: Secondary | ICD-10-CM | POA: Diagnosis present

## 2017-06-11 DIAGNOSIS — Z716 Tobacco abuse counseling: Secondary | ICD-10-CM

## 2017-06-11 DIAGNOSIS — J189 Pneumonia, unspecified organism: Secondary | ICD-10-CM | POA: Diagnosis present

## 2017-06-11 DIAGNOSIS — Z6836 Body mass index (BMI) 36.0-36.9, adult: Secondary | ICD-10-CM

## 2017-06-11 DIAGNOSIS — F17210 Nicotine dependence, cigarettes, uncomplicated: Secondary | ICD-10-CM | POA: Diagnosis present

## 2017-06-11 DIAGNOSIS — J869 Pyothorax without fistula: Principal | ICD-10-CM | POA: Diagnosis present

## 2017-06-11 DIAGNOSIS — R739 Hyperglycemia, unspecified: Secondary | ICD-10-CM | POA: Diagnosis not present

## 2017-06-11 DIAGNOSIS — J440 Chronic obstructive pulmonary disease with acute lower respiratory infection: Secondary | ICD-10-CM | POA: Diagnosis present

## 2017-06-11 DIAGNOSIS — E669 Obesity, unspecified: Secondary | ICD-10-CM | POA: Diagnosis present

## 2017-06-11 DIAGNOSIS — B9689 Other specified bacterial agents as the cause of diseases classified elsewhere: Secondary | ICD-10-CM | POA: Diagnosis present

## 2017-06-11 DIAGNOSIS — Z833 Family history of diabetes mellitus: Secondary | ICD-10-CM

## 2017-06-11 DIAGNOSIS — Z720 Tobacco use: Secondary | ICD-10-CM

## 2017-06-11 DIAGNOSIS — J181 Lobar pneumonia, unspecified organism: Secondary | ICD-10-CM | POA: Diagnosis present

## 2017-06-11 DIAGNOSIS — J180 Bronchopneumonia, unspecified organism: Secondary | ICD-10-CM | POA: Diagnosis present

## 2017-06-11 DIAGNOSIS — Z9889 Other specified postprocedural states: Secondary | ICD-10-CM

## 2017-06-11 LAB — I-STAT CHEM 8, ED
BUN: 12 mg/dL (ref 6–20)
CREATININE: 0.8 mg/dL (ref 0.61–1.24)
Calcium, Ion: 1.01 mmol/L — ABNORMAL LOW (ref 1.15–1.40)
Chloride: 94 mmol/L — ABNORMAL LOW (ref 101–111)
Glucose, Bld: 119 mg/dL — ABNORMAL HIGH (ref 65–99)
HEMATOCRIT: 41 % (ref 39.0–52.0)
Hemoglobin: 13.9 g/dL (ref 13.0–17.0)
POTASSIUM: 4 mmol/L (ref 3.5–5.1)
SODIUM: 133 mmol/L — AB (ref 135–145)
TCO2: 28 mmol/L (ref 22–32)

## 2017-06-11 LAB — PROCALCITONIN: Procalcitonin: 0.63 ng/mL

## 2017-06-11 LAB — CBC WITH DIFFERENTIAL/PLATELET
BASOS PCT: 0 %
Basophils Absolute: 0 10*3/uL (ref 0.0–0.1)
Eosinophils Absolute: 0.1 10*3/uL (ref 0.0–0.7)
Eosinophils Relative: 1 %
HCT: 38.4 % — ABNORMAL LOW (ref 39.0–52.0)
Hemoglobin: 13.1 g/dL (ref 13.0–17.0)
LYMPHS PCT: 14 %
Lymphs Abs: 1.8 10*3/uL (ref 0.7–4.0)
MCH: 32.8 pg (ref 26.0–34.0)
MCHC: 34.1 g/dL (ref 30.0–36.0)
MCV: 96.2 fL (ref 78.0–100.0)
Monocytes Absolute: 1.2 10*3/uL — ABNORMAL HIGH (ref 0.1–1.0)
Monocytes Relative: 9 %
NEUTROS ABS: 10.1 10*3/uL — AB (ref 1.7–7.7)
NEUTROS PCT: 76 %
Platelets: 382 10*3/uL (ref 150–400)
RBC: 3.99 MIL/uL — AB (ref 4.22–5.81)
RDW: 12.2 % (ref 11.5–15.5)
WBC: 13.2 10*3/uL — AB (ref 4.0–10.5)

## 2017-06-11 LAB — I-STAT CG4 LACTIC ACID, ED: Lactic Acid, Venous: 1.25 mmol/L (ref 0.5–1.9)

## 2017-06-11 MED ORDER — SODIUM CHLORIDE 0.9 % IV SOLN
2.0000 g | INTRAVENOUS | Status: DC
  Filled 2017-06-11: qty 20

## 2017-06-11 MED ORDER — ENOXAPARIN SODIUM 40 MG/0.4ML ~~LOC~~ SOLN
40.0000 mg | SUBCUTANEOUS | Status: DC
  Administered 2017-06-11 – 2017-06-12 (×2): 40 mg via SUBCUTANEOUS
  Filled 2017-06-11: qty 0.4

## 2017-06-11 MED ORDER — IOPAMIDOL (ISOVUE-300) INJECTION 61%
75.0000 mL | Freq: Once | INTRAVENOUS | Status: AC | PRN
  Administered 2017-06-11: 75 mL via INTRAVENOUS

## 2017-06-11 MED ORDER — SODIUM CHLORIDE 0.9 % IV SOLN
1.0000 g | Freq: Once | INTRAVENOUS | Status: AC
  Administered 2017-06-11: 1 g via INTRAVENOUS
  Filled 2017-06-11: qty 10

## 2017-06-11 MED ORDER — SODIUM CHLORIDE 0.9 % IV SOLN
500.0000 mg | Freq: Once | INTRAVENOUS | Status: AC
  Administered 2017-06-11: 500 mg via INTRAVENOUS
  Filled 2017-06-11: qty 500

## 2017-06-11 MED ORDER — ALBUTEROL SULFATE (2.5 MG/3ML) 0.083% IN NEBU: 5.0000 mg | INHALATION_SOLUTION | Freq: Once | RESPIRATORY_TRACT | Status: DC

## 2017-06-11 MED ORDER — ALBUTEROL SULFATE (2.5 MG/3ML) 0.083% IN NEBU
2.5000 mg | INHALATION_SOLUTION | Freq: Four times a day (QID) | RESPIRATORY_TRACT | Status: DC | PRN
  Administered 2017-06-12 (×2): 2.5 mg via RESPIRATORY_TRACT
  Filled 2017-06-11 (×2): qty 3

## 2017-06-11 MED ORDER — NICOTINE 7 MG/24HR TD PT24
7.0000 mg | MEDICATED_PATCH | Freq: Every day | TRANSDERMAL | Status: DC
  Administered 2017-06-11 – 2017-06-18 (×7): 7 mg via TRANSDERMAL
  Filled 2017-06-11 (×8): qty 1

## 2017-06-11 MED ORDER — IPRATROPIUM BROMIDE 0.02 % IN SOLN
0.5000 mg | Freq: Once | RESPIRATORY_TRACT | Status: AC
  Administered 2017-06-11: 0.5 mg via RESPIRATORY_TRACT
  Filled 2017-06-11: qty 2.5

## 2017-06-11 MED ORDER — PIPERACILLIN-TAZOBACTAM 3.375 G IVPB
3.3750 g | Freq: Three times a day (TID) | INTRAVENOUS | Status: DC
Start: 2017-06-11 — End: 2017-06-11
  Administered 2017-06-11: 3.375 g via INTRAVENOUS
  Filled 2017-06-11 (×2): qty 50

## 2017-06-11 MED ORDER — SODIUM CHLORIDE 0.9 % IV SOLN
INTRAVENOUS | Status: DC
  Administered 2017-06-11: 19:00:00 via INTRAVENOUS
  Administered 2017-06-12: 75 mL/h via INTRAVENOUS
  Administered 2017-06-12: 10:00:00 via INTRAVENOUS

## 2017-06-11 MED ORDER — AZITHROMYCIN 250 MG PO TABS: 500.0000 mg | ORAL_TABLET | ORAL | Status: DC

## 2017-06-11 MED ORDER — ALBUTEROL SULFATE (2.5 MG/3ML) 0.083% IN NEBU
5.0000 mg | INHALATION_SOLUTION | Freq: Once | RESPIRATORY_TRACT | Status: AC
  Administered 2017-06-11: 5 mg via RESPIRATORY_TRACT
  Filled 2017-06-11: qty 6

## 2017-06-11 MED ORDER — HYDROCODONE-ACETAMINOPHEN 5-325 MG PO TABS: 1.0000 | ORAL_TABLET | Freq: Four times a day (QID) | ORAL | Status: DC | PRN

## 2017-06-11 NOTE — H&P (Signed)
History and Physical    Andrew Mueller BJY:[id number] DOB: 11-04-1980 DOA: 06/11/2017  PCP: Patient, No Pcp Per Patient coming from: Home  Chief Complaint: Right chest pain  HPI: Andrew Mueller is a 37 y.o. male with no significant medical history.  Patient presented with 3-week history of right-sided chest pain.  He was evaluated in the emergency department 1-1/2 weeks ago with a diagnosis of pleurisy and prescribed analgesics, steroids, azithromycin.  Patient states that he took analgesics and steroids with associated worsening symptoms.  He has not taken azithromycin.  Last night, he reports having development of a cough with sputum production.  Sputum is described as slightly green-tinged.  He reports subjective fevers and chills  ED Course: Vitals: Afebrile, normal pulse, no respirations, initially hypertensive and now normotensive.  On room air. Labs: Sodium 133, glucose of 119, lactic acid of 1.25, WBC 13.2 K Imaging: CT scans for right lower and middle pneumonia with associated empyema; right hilar adenopathy which is likely reactive. Medications/Course: Ceftriaxone, azithromycin, albuterol, Atrovent  Review of Systems: Review of Systems  Constitutional: Positive for chills and fever (subjective).  Respiratory: Positive for cough and sputum production. Negative for hemoptysis, shortness of breath and wheezing.   Cardiovascular: Positive for chest pain (right sided).  Gastrointestinal: Negative for abdominal pain, constipation, diarrhea, nausea and vomiting.  All other systems reviewed and are negative.   History reviewed. No pertinent past medical history.  Past Surgical History:  Procedure Laterality Date  . HAND SURGERY       reports that he has been smoking cigarettes.  He has a 20.00 pack-year smoking history. He has never used smokeless tobacco. He reports that he does not drink alcohol or use drugs.  No Known Allergies  Family History  Problem Relation Age of Onset    . Diabetes Mother   . Diabetes Father     Prior to Admission medications   Medication Sig Start Date End Date Taking? Authorizing Provider  meloxicam (MOBIC) 15 MG tablet Take 1 tablet (15 mg total) by mouth daily. 05/31/17  Yes Harris, Abigail, PA-C  oxyCODONE-acetaminophen (PERCOCET) 5-325 MG tablet Take 1-2 tablets by mouth every 4 (four) hours as needed. 05/31/17  Yes Harris, Abigail, PA-C  predniSONE (STERAPRED UNI-PAK 21 TAB) 10 MG (21) TBPK tablet Use as directed 06/02/17  Yes Harris, Abigail, PA-C  azithromycin (ZITHROMAX Z-PAK) 250 MG tablet 2 po day one, then 1 daily x 4 days Patient not taking: Reported on 06/11/2017 11/26/14   Arthor Captain, PA-C  guaiFENesin-codeine 100-10 MG/5ML syrup Take 5-10 mLs by mouth every 6 (six) hours as needed for cough. Patient not taking: Reported on 06/11/2017 11/26/14   Arthor Captain, PA-C  HYDROcodone-acetaminophen (NORCO/VICODIN) 5-325 MG per tablet Take 1 tablet by mouth every 6 (six) hours as needed. Patient not taking: Reported on 06/11/2017 05/23/14   Bethann Berkshire, MD  naproxen (NAPROSYN) 500 MG tablet Take 1 tablet (500 mg total) by mouth 2 (two) times daily. Patient not taking: Reported on 06/11/2017 05/23/14   Bethann Berkshire, MD    Physical Exam: Vitals:   06/11/17 1242 06/11/17 1300 06/11/17 1330 06/11/17 1547  BP:  (!) 143/73 (!) 156/77 135/72  Pulse:  95 97 94  Resp:  Temp:    98.7 F (37.1 C)  TempSrc:    Oral  SpO2: 95% 99% 96% 96%  Weight:      Height:         Constitutional: NAD, calm, comfortable Eyes: PERRL,  lids and conjunctivae normal ENMT: Mucous membranes are dry. Posterior pharynx clear of any lesions. Poor dentition.  Neck: normal, supple, no masses, no thyromegaly Respiratory: Wheezing bilaterally with significantly diminished breath sounds in right middle and lower lobes with associated rales. Normal respiratory effort. No accessory muscle use.  Cardiovascular: Regular rate and rhythm, no murmurs /  rubs / gallops. No extremity edema. 2+ pedal pulses. No carotid bruits.  Abdomen: no tenderness, no masses palpated. No hepatosplenomegaly. Bowel sounds positive.  Musculoskeletal: no clubbing / cyanosis. No joint deformity upper and lower extremities. Good ROM, no contractures. Normal muscle tone.  Skin: no rashes, lesions, ulcers. No induration Neurologic: CN 2-12 grossly intact. Sensation intact, DTR normal. Strength 5/5 in all 4.  Psychiatric: Normal judgment and insight. Alert and oriented x 3. Normal mood.    Labs on Admission: I have personally reviewed following labs and imaging studies  CBC: Recent Labs  Lab 06/11/17 1221 06/11/17 1229  WBC 13.2*  --   NEUTROABS 10.1*  --   HGB 13.1 13.9  HCT 38.4* 41.0  MCV 96.2  --   PLT 382  --    Basic Metabolic Panel: Recent Labs  Lab 06/11/17 1229  NA 133*  K 4.0  CL 94*  GLUCOSE 119*  BUN 12  CREATININE 0.80   GFR: Estimated Creatinine Clearance: 173.4 mL/min (by C-G formula based on SCr of 0.8 mg/dL). Liver Function Tests: No results for input(s): AST, ALT, ALKPHOS, BILITOT, PROT, ALBUMIN in the last 168 hours. No results for input(s): LIPASE, AMYLASE in the last 168 hours. No results for input(s): AMMONIA in the last 168 hours. Coagulation Profile: No results for input(s): INR, PROTIME in the last 168 hours. Cardiac Enzymes: No results for input(s): CKTOTAL, CKMB, CKMBINDEX, TROPONINI in the last 168 hours. BNP (last 3 results) No results for input(s): PROBNP in the last 8760 hours. HbA1C: No results for input(s): HGBA1C in the last 72 hours. CBG: No results for input(s): GLUCAP in the last 168 hours. Lipid Profile: No results for input(s): CHOL, HDL, LDLCALC, TRIG, CHOLHDL, LDLDIRECT in the last 72 hours. Thyroid Function Tests: No results for input(s): TSH, T4TOTAL, FREET4, T3FREE, THYROIDAB in the last 72 hours. Anemia Panel: No results for input(s): VITAMINB12, FOLATE, FERRITIN, TIBC, IRON, RETICCTPCT in the  last 72 hours. Urine analysis: No results found for: COLORURINE, APPEARANCEUR, LABSPEC, PHURINE, GLUCOSEU, HGBUR, BILIRUBINUR, KETONESUR, PROTEINUR, UROBILINOGEN, NITRITE, LEUKOCYTESUR Sepsis Labs: !!!!!!!!!!!!!!!!!!!!!!!!!!!!!!!!!!!!!!!!!!!! (procalcitonin:4,lacticidven:4) )No results found for this or any previous visit (from the past 240 hour(s)).   Radiological Exams on Admission: Dg Chest 2 View  Result Date: 06/11/2017 CLINICAL DATA:  Right lower chest pain, shortness of breath, cough EXAM: CHEST - 2 VIEW COMPARISON:  05/31/2017 FINDINGS: There is airspace consolidation noted in the inferior right upper lobe and right lower lobe, worsening since prior study. Air-fluid level is noted peripherally in the right lower hemithorax which could reflect abscess or bronchopleural fistula. Left lung clear. Heart is normal size. No acute bony abnormality. IMPRESSION: Worsening airspace consolidation in the right lower lobe and inferior right upper lobe concerning for pneumonia. Short air-fluid level noted peripherally at the right lung base could be parenchymal or pleural and represent abscess or bronchopleural fistula. Recommend further evaluation with chest CT with IV contrast. Electronically Signed   By: Charlett Nose M.D.   On: 06/11/2017 09:09   Ct Chest W Contrast  Result Date: 06/11/2017 CLINICAL DATA:  Shortness of breath, right lateral chest pain for 1 month, and fever. EXAM:  CT CHEST WITH CONTRAST TECHNIQUE: Multidetector CT imaging of the chest was performed during intravenous contrast administration. CONTRAST:  75mL ISOVUE-300 IOPAMIDOL (ISOVUE-300) INJECTION 61% COMPARISON:  05/31/2017. FINDINGS: Cardiovascular: Vascular structures are unremarkable. Heart size normal. No pericardial effusion. Mediastinum/Nodes: Mediastinal lymph nodes measure up to 1.6 cm in the low right paratracheal station, previously 9 mm. Subcarinal lymph node measures 1.7 cm, previously 12 mm. Right hilar  adenopathy measures up to 1.6 cm, previously 11 mm. No axillary adenopathy. Esophagus is grossly unremarkable. Lungs/Pleura: Septal thickening, peribronchovascular nodularity/consolidation and volume loss in the right middle and right lower lobes are largely new from 05/31/2017. In addition, there are sizable loculated collections of pleural fluid and air in the right hemithorax, also largely new from 05/31/2017. Probable subpleural lymph nodes along the minor fissure. Left lung is clear. Debris is seen dependently in the trachea. Upper Abdomen: Liver may be slightly decreased in attenuation diffusely. Visualized portions of the liver, gallbladder, adrenal glands, kidneys, spleen, pancreas stomach and bowel are otherwise grossly unremarkable. There are several upper abdominal lymph nodes which measure up to 10 mm in the gastrohepatic ligament. Musculoskeletal: Degenerative changes in the spine. IMPRESSION: 1. Septal thickening, peribronchovascular nodularity/consolidation and loculated collections of pleural fluid/air in the right hemithorax, largely new from 05/31/2017 and most indicative of bronchopneumonia with empyema. Difficult to definitively exclude a bronchopleural fistula. 2. New mediastinal/subcarinal and right hilar adenopathy, likely reactive. 3. Liver may be steatotic. Electronically Signed   By: Leanna Battles M.D.   On: 06/11/2017 14:51    EKG: Independently reviewed.  Normal sinus rhythm  Assessment/Plan Active Problems:   Right lower lobe pneumonia (HCC)   Right lower lobe pneumonia Right-sided empyema Community-acquired.  No risk factors for HCAP. -Sputum Gram stain and culture pending -Blood cultures pending -Pulmonology recommendations -Continue ceftriaxone and azithromycin pending culture data  Wheezing Likely secondary to underlying lung disease in setting of significant tobacco use -Albuterol as needed  Tobacco abuse Cessation discussed with patient -Nicotine  patch   DVT prophylaxis: Lovenox Code Status: Full code Family Communication: None at bedside Disposition Plan: Discharge in 2-4 days pending workup/management of pneumonia/empyema Consults called: Pulmonology Admission status: Inpatient, medical floor   Jacquelin Hawking, MD [facility] Pager [phone]  If 7PM-7AM, please contact night-coverage [email or web address] Password [employer] [facility]  06/11/2017, 5:05 PM

## 2017-06-11 NOTE — ED Provider Notes (Signed)
[facility]-EMERGENCY DEPT Provider Note   CSN: [id number] Arrival date & time: 06/11/17  0741     History   Chief Complaint Chief Complaint  Patient presents with  . Shortness of Breath  . Cough  . rib cage pain    [name] is a 37 y.o. male.  HPI   37 year old male presenting for evaluation of cough and shortness of breath.  Patient states for nearly a month he has had pain to his right side chest.  Initially pain was involving the right shoulder 7 days later it resides to his right side of chest.  Pain is pleuritic in nature, worsening with laying on certain side, and with breathing.  He has been coughing profusely and having increased shortness of breath.  He mentioned been evaluated 11 days ago it the ED for his condition.  States that he was diagnosed with pleurisy.  Patient was sent home with steroid, and pain medication in which he took for the full duration but symptoms has progressed.  He was endorse some fever and chills but that has since resolved.  He denies any associated nausea vomiting or diarrhea but report decrease in appetite.  He now report having shortness of breath even with a short walk.  He denies any leg swelling or calf pain, no prior history of PE DVT, no recent surgery or prolonged bedrest.  He does drive approximately an hour and a half each way to and from work which is not new.  No recent travel.  He is a smoker and usually smoked a pack daily.  Denies alcohol abuse.  History reviewed. No pertinent past medical history.  There are no active problems to display for this patient.   Past Surgical History:  Procedure Laterality Date  . HAND SURGERY          Home Medications    Prior to Admission medications   Medication Sig Start Date End Date Taking? Authorizing Provider  meloxicam (MOBIC) 15 MG tablet Take 1 tablet (15 mg total) by mouth daily. 05/31/17  Yes Harris, Abigail, PA-C  oxyCODONE-acetaminophen (PERCOCET)  5-325 MG tablet Take 1-2 tablets by mouth every 4 (four) hours as needed. 05/31/17  Yes Harris, Abigail, PA-C  predniSONE (STERAPRED UNI-PAK 21 TAB) 10 MG (21) TBPK tablet Use as directed 06/02/17  Yes Harris, Abigail, PA-C  azithromycin (ZITHROMAX Z-PAK) 250 MG tablet 2 po day one, then 1 daily x 4 days Patient not taking: Reported on 06/11/2017 11/26/14   Arthor Captain, PA-C  guaiFENesin-codeine 100-10 MG/5ML syrup Take 5-10 mLs by mouth every 6 (six) hours as needed for cough. Patient not taking: Reported on 06/11/2017 11/26/14   Arthor Captain, PA-C  HYDROcodone-acetaminophen (NORCO/VICODIN) 5-325 MG per tablet Take 1 tablet by mouth every 6 (six) hours as needed. Patient not taking: Reported on 06/11/2017 05/23/14   Bethann Berkshire, MD  naproxen (NAPROSYN) 500 MG tablet Take 1 tablet (500 mg total) by mouth 2 (two) times daily. Patient not taking: Reported on 06/11/2017 05/23/14   Bethann Berkshire, MD    Family History Family History  Problem Relation Age of Onset  . Diabetes Mother   . Diabetes Father     Social History Social History   Tobacco Use  . Smoking status: Current Every Day Smoker    Packs/day: 1.00    Years: 12.00    Pack years: 12.00    Types: Cigarettes  . Smokeless tobacco: Never Used  Substance Use Topics  . Alcohol  use: No  . Drug use: No     Allergies   Patient has no known allergies.   Review of Systems Review of Systems  All other systems reviewed and are negative.    Physical Exam Updated Vital Signs BP (!) 151/94 (BP Location: Right Arm)   Pulse 99   Temp 98.9 F (37.2 C) (Oral)   Resp 17   Ht  (1.905 m)   Wt 117.9 kg (260 lb)   SpO2 95%   BMI 32.50 kg/m   Physical Exam  Constitutional: He appears well-developed and well-nourished. No distress.  HENT:  Head: Atraumatic.  Eyes: Conjunctivae are normal.  Neck: Neck supple.  Cardiovascular: Normal rate and regular rhythm.  Pulmonary/Chest: No accessory muscle usage or stridor. No  tachypnea. He has decreased breath sounds. He has wheezes. He has no rhonchi. He has rales.  Neurological: He is alert.  Skin: No rash noted.  Psychiatric: He has a normal mood and affect.  Nursing note and vitals reviewed.    ED Treatments / Results  Labs (all labs ordered are listed, but only abnormal results are displayed) Labs Reviewed  CBC WITH DIFFERENTIAL/PLATELET - Abnormal; Notable for the following components:      Result Value   WBC 13.2 (*)    RBC 3.99 (*)    HCT 38.4 (*)    Neutro Abs 10.1 (*)    Monocytes Absolute 1.2 (*)    All other components within normal limits  I-STAT CHEM 8, ED - Abnormal; Notable for the following components:   Sodium 133 (*)    Chloride 94 (*)    Glucose, Bld 119 (*)    Calcium, Ion 1.01 (*)    All other components within normal limits  CULTURE, BLOOD (ROUTINE X 2)  CULTURE, BLOOD (ROUTINE X 2)  CULTURE, EXPECTORATED SPUTUM-ASSESSMENT  MRSA PCR SCREENING  HIV ANTIBODY (ROUTINE TESTING)  PROCALCITONIN  I-STAT CG4 LACTIC ACID, ED    EKG None  ED ECG REPORT   Date: 06/11/2017  Rate: 95  Rhythm: normal sinus rhythm  QRS Axis: normal  Intervals: normal  ST/T Wave abnormalities: nonspecific ST changes  Conduction Disutrbances:none  Narrative Interpretation:   Old EKG Reviewed: none available  I have personally reviewed the EKG tracing and agree with the computerized printout as noted.   Radiology Dg Chest 2 View  Result Date: 06/11/2017 CLINICAL DATA:  Right lower chest pain, shortness of breath, cough EXAM: CHEST - 2 VIEW COMPARISON:  05/31/2017 FINDINGS: There is airspace consolidation noted in the inferior right upper lobe and right lower lobe, worsening since prior study. Air-fluid level is noted peripherally in the right lower hemithorax which could reflect abscess or bronchopleural fistula. Left lung clear. Heart is normal size. No acute bony abnormality. IMPRESSION: Worsening airspace consolidation in the right lower lobe  and inferior right upper lobe concerning for pneumonia. Short air-fluid level noted peripherally at the right lung base could be parenchymal or pleural and represent abscess or bronchopleural fistula. Recommend further evaluation with chest CT with IV contrast. Electronically Signed   By: Charlett Nose M.D.   On: 06/11/2017 09:09   Ct Chest W Contrast  Result Date: 06/11/2017 CLINICAL DATA:  Shortness of breath, right lateral chest pain for 1 month, and fever. EXAM: CT CHEST WITH CONTRAST TECHNIQUE: Multidetector CT imaging of the chest was performed during intravenous contrast administration. CONTRAST:  75mL ISOVUE-300 IOPAMIDOL (ISOVUE-300) INJECTION 61% COMPARISON:  05/31/2017. FINDINGS: Cardiovascular: Vascular structures are unremarkable. Heart size normal.  No pericardial effusion. Mediastinum/Nodes: Mediastinal lymph nodes measure up to 1.6 cm in the low right paratracheal station, previously 9 mm. Subcarinal lymph node measures 1.7 cm, previously 12 mm. Right hilar adenopathy measures up to 1.6 cm, previously 11 mm. No axillary adenopathy. Esophagus is grossly unremarkable. Lungs/Pleura: Septal thickening, peribronchovascular nodularity/consolidation and volume loss in the right middle and right lower lobes are largely new from 05/31/2017. In addition, there are sizable loculated collections of pleural fluid and air in the right hemithorax, also largely new from 05/31/2017. Probable subpleural lymph nodes along the minor fissure. Left lung is clear. Debris is seen dependently in the trachea. Upper Abdomen: Liver may be slightly decreased in attenuation diffusely. Visualized portions of the liver, gallbladder, adrenal glands, kidneys, spleen, pancreas stomach and bowel are otherwise grossly unremarkable. There are several upper abdominal lymph nodes which measure up to 10 mm in the gastrohepatic ligament. Musculoskeletal: Degenerative changes in the spine. IMPRESSION: 1. Septal thickening,  peribronchovascular nodularity/consolidation and loculated collections of pleural fluid/air in the right hemithorax, largely new from 05/31/2017 and most indicative of bronchopneumonia with empyema. Difficult to definitively exclude a bronchopleural fistula. 2. New mediastinal/subcarinal and right hilar adenopathy, likely reactive. 3. Liver may be steatotic. Electronically Signed   By: Leanna Battles M.D.   On: 06/11/2017 14:51    Procedures Procedures (including critical care time)  Medications Ordered in ED Medications  albuterol (PROVENTIL) (2.5 MG/3ML) 0.083% nebulizer solution 5 mg (5 mg Nebulization Not Given 06/11/17 1032)  piperacillin-tazobactam (ZOSYN) IVPB 3.375 g (has no administration in time range)  cefTRIAXone (ROCEPHIN) 1 g in sodium chloride 0.9 % 100 mL IVPB (0 g Intravenous Stopped 06/11/17 1321)  azithromycin (ZITHROMAX) 500 mg in sodium chloride 0.9 % 250 mL IVPB (0 mg Intravenous Stopped 06/11/17 1348)  albuterol (PROVENTIL) (2.5 MG/3ML) 0.083% nebulizer solution 5 mg (5 mg Nebulization Given 06/11/17 1242)  ipratropium (ATROVENT) nebulizer solution 0.5 mg (0.5 mg Nebulization Given 06/11/17 1242)  iopamidol (ISOVUE-300) 61 % injection 75 mL (75 mLs Intravenous Contrast Given 06/11/17 1413)     Initial Impression / Assessment and Plan / ED Course  I have reviewed the triage vital signs and the nursing notes.  Pertinent labs & imaging results that were available during my care of the patient were reviewed by me and considered in my medical decision making (see chart for details).     BP 135/72 (BP Location: Left Arm)   Pulse 94   Temp 98.7 F (37.1 C) (Oral)   Resp 18   Ht  (1.905 m)   Wt 117.9 kg (260 lb)   SpO2 96%   BMI 32.50 kg/m    Final Clinical Impressions(s) / ED Diagnoses   Final diagnoses:  Empyema lung (HCC)  Bronchopneumonia    ED Discharge Orders    None     3:52 PM Patient with right-sided pleuritic chest pain for nearly a month.   Endorses shortness of breath and subjective fever and chills.  Does have an elevated white count of 13.2 and an initial x-ray showing worsening airspace consolidation in the right lower lobe and inferior right upper lobe concerning for pneumonia.  A CT of the chest was obtained which demonstrates septal thickening, peribronchial vascular nodularity/consolidation and loculated collection of the pleural fluid/air in the right hemithorax which is largely new and indicative of bronchopneumonia with empyema.  Patient was started on Rocephin and Zithromax to cover for communicable acquired pneumonia.  Appreciate consultation from tried hospitalist, Dr. Margo Aye who agrees to  admit patient for further management.  She also request surgery to be involved as patient may need drainage of his empyema.  Will consult general surgery.  Care discussed with Dr. Estell Harpin.  Patient also receive DuoNeb's treatment for his wheezing.  Low suspicion for PE.  4:19 PM Appreciate consultation from cardiothoracic specialist who agrees to be involved in patient's care.  Patient voiced understanding and agrees with plan to be admitted.  He is currently stable.   Fayrene Helper, PA-C 06/11/17 1625    Bethann Berkshire, MD 06/12/17 [phone]

## 2017-06-11 NOTE — ED Notes (Signed)
Attempted blood draw, was unsuccessful  

## 2017-06-11 NOTE — ED Notes (Signed)
Bed: WA06 Expected date:  Expected time:  Means of arrival:  Comments: 

## 2017-06-11 NOTE — Progress Notes (Signed)
Pharmacy Antibiotic Note  Andrew Mueller is a 37 y.o. male admitted on 06/11/2017 with empyema.  Pharmacy has been consulted for zosysn dosing.  Plan:  Zosyn 3.375gm IV q8h over 4h infusion  Do not anticipate need for renal dose adjustment, pharmacy will sign-off  Height:  (190.5 cm) Weight: 260 lb (117.9 kg) IBW/kg (Calculated) : 84.5  Temp (24hrs), Avg:98.8 F (37.1 C), Min:98.7 F (37.1 C), Max:98.9 F (37.2 C)  Recent Labs  Lab 06/11/17 1221 06/11/17 1229  WBC 13.2*  --   CREATININE  --  0.80  LATICACIDVEN  --  1.25    Estimated Creatinine Clearance: 173.4 mL/min (by C-G formula based on SCr of 0.8 mg/dL).    No Known Allergies  Antimicrobials this admission: 5/29 Ceftriaxone/azithromycin x 1 5/29 zosyn >>  Dose adjustments this admission:  Microbiology results: 5/29 MRSA PCR:  5/29 Sputum cx:  5/29 Bcx (drawn after ceftriaxone/azith x1)  Thank you for allowing pharmacy to be a part of this patient's care.  Dannielle Huh 06/11/2017 4:13 PM

## 2017-06-11 NOTE — ED Notes (Signed)
Gave report to Thea Silversmith, Charity fundraiser for room [phone]. Notified transporter for patient to take patient upstairs.

## 2017-06-11 NOTE — ED Triage Notes (Signed)
Patient reports that he has had a cough and right rib cage pain x 4 weeks. Patient reports that he was seen at [facility] approx 11 days ago and diagnosed with pleurisy. Patient states he continues to have SOB and a productive cough with green sputum.

## 2017-06-11 NOTE — ED Notes (Signed)
ED TO INPATIENT HANDOFF REPORT  Name/Age/Gender Andrew Mueller 37 y.o. male  Code Status   Home/SNF/Other Home  Chief Complaint SOB; Side Pain  Level of Care/Admitting Diagnosis ED Disposition    ED Disposition Condition Comment   Admit  Hospital Area: [facility] [[id number]]  Level of Care: Med-Surg [16]  Diagnosis: Right lower lobe pneumonia [facility]) [[id number]]  Admitting Physician: Narda Bonds [phone]  Attending Physician: Narda Bonds [phone]  Estimated length of stay: past midnight tomorrow  Certification:: I certify this patient will need inpatient services for at least 2 midnights  PT Class (Do Not Modify): Inpatient [101]  PT Acc Code (Do Not Modify): Private [1]       Medical History History reviewed. No pertinent past medical history.  Allergies No Known Allergies  IV Location/Drains/Wounds Patient Lines/Drains/Airways Status   Active Line/Drains/Airways    Name:   Placement date:   Placement time:   Site:   Days:   Peripheral IV 06/11/17 Right Hand   06/11/17    1220    Hand   less than 1          Labs/Imaging Results for orders placed or performed during the hospital encounter of 06/11/17 (from the past 48 hour(s))  CBC with Differential/Platelet     Status: Abnormal   Collection Time: 06/11/17 12:21 PM  Result Value Ref Range   WBC 13.2 (H) 4.0 - 10.5 K/uL   RBC 3.99 (L) 4.22 - 5.81 MIL/uL   Hemoglobin 13.1 13.0 - 17.0 g/dL   HCT 56.4 (L) 33.2 - 95.1 %   MCV 96.2 78.0 - 100.0 fL   MCH 32.8 26.0 - 34.0 pg   MCHC 34.1 30.0 - 36.0 g/dL   RDW 88.4 16.6 - 06.3 %   Platelets 382 150 - 400 K/uL   Neutrophils Relative % 76 %   Neutro Abs 10.1 (H) 1.7 - 7.7 K/uL   Lymphocytes Relative 14 %   Lymphs Abs 1.8 0.7 - 4.0 K/uL   Monocytes Relative 9 %   Monocytes Absolute 1.2 (H) 0.1 - 1.0 K/uL   Eosinophils Relative 1 %   Eosinophils Absolute 0.1 0.0 - 0.7 K/uL   Basophils Relative 0 %   Basophils Absolute 0.0 0.0 - 0.1 K/uL     Comment: Performed at [facility], [address], [place], [place] [address]  I-stat chem 8, ed     Status: Abnormal   Collection Time: 06/11/17 12:29 PM  Result Value Ref Range   Sodium 133 (L) 135 - 145 mmol/L   Potassium 4.0 3.5 - 5.1 mmol/L   Chloride 94 (L) 101 - 111 mmol/L   BUN 12 6 - 20 mg/dL   Creatinine, Ser 0.93 0.61 - 1.24 mg/dL   Glucose, Bld 235 (H) 65 - 99 mg/dL   Calcium, Ion 5.73 (L) 1.15 - 1.40 mmol/L   TCO2 28 22 - 32 mmol/L   Hemoglobin 13.9 13.0 - 17.0 g/dL   HCT 22.0 25.4 - 27.0 %  I-Stat CG4 Lactic Acid, ED     Status: None   Collection Time: 06/11/17 12:29 PM  Result Value Ref Range   Lactic Acid, Venous 1.25 0.5 - 1.9 mmol/L   Dg Chest 2 View  Result Date: 06/11/2017 CLINICAL DATA:  Right lower chest pain, shortness of breath, cough EXAM: CHEST - 2 VIEW COMPARISON:  05/31/2017 FINDINGS: There is airspace consolidation noted in the inferior right upper lobe and right lower lobe, worsening  since prior study. Air-fluid level is noted peripherally in the right lower hemithorax which could reflect abscess or bronchopleural fistula. Left lung clear. Heart is normal size. No acute bony abnormality. IMPRESSION: Worsening airspace consolidation in the right lower lobe and inferior right upper lobe concerning for pneumonia. Short air-fluid level noted peripherally at the right lung base could be parenchymal or pleural and represent abscess or bronchopleural fistula. Recommend further evaluation with chest CT with IV contrast. Electronically Signed   By: Charlett Nose M.D.   On: 06/11/2017 09:09   Ct Chest W Contrast  Result Date: 06/11/2017 CLINICAL DATA:  Shortness of breath, right lateral chest pain for 1 month, and fever. EXAM: CT CHEST WITH CONTRAST TECHNIQUE: Multidetector CT imaging of the chest was performed during intravenous contrast administration. CONTRAST:  75mL ISOVUE-300 IOPAMIDOL (ISOVUE-300) INJECTION 61% COMPARISON:  05/31/2017. FINDINGS:  Cardiovascular: Vascular structures are unremarkable. Heart size normal. No pericardial effusion. Mediastinum/Nodes: Mediastinal lymph nodes measure up to 1.6 cm in the low right paratracheal station, previously 9 mm. Subcarinal lymph node measures 1.7 cm, previously 12 mm. Right hilar adenopathy measures up to 1.6 cm, previously 11 mm. No axillary adenopathy. Esophagus is grossly unremarkable. Lungs/Pleura: Septal thickening, peribronchovascular nodularity/consolidation and volume loss in the right middle and right lower lobes are largely new from 05/31/2017. In addition, there are sizable loculated collections of pleural fluid and air in the right hemithorax, also largely new from 05/31/2017. Probable subpleural lymph nodes along the minor fissure. Left lung is clear. Debris is seen dependently in the trachea. Upper Abdomen: Liver may be slightly decreased in attenuation diffusely. Visualized portions of the liver, gallbladder, adrenal glands, kidneys, spleen, pancreas stomach and bowel are otherwise grossly unremarkable. There are several upper abdominal lymph nodes which measure up to 10 mm in the gastrohepatic ligament. Musculoskeletal: Degenerative changes in the spine. IMPRESSION: 1. Septal thickening, peribronchovascular nodularity/consolidation and loculated collections of pleural fluid/air in the right hemithorax, largely new from 05/31/2017 and most indicative of bronchopneumonia with empyema. Difficult to definitively exclude a bronchopleural fistula. 2. New mediastinal/subcarinal and right hilar adenopathy, likely reactive. 3. Liver may be steatotic. Electronically Signed   By: Leanna Battles M.D.   On: 06/11/2017 14:51    Pending Labs Unresulted Labs (From admission, onward)   Start     Ordered   06/11/17 1606  MRSA PCR Screening  Once,   R     06/11/17 1605   06/11/17 1600  Procalcitonin - Baseline  STAT,   STAT     06/11/17 1600   06/11/17 1558  Culture, expectorated sputum-assessment   Once,   R     06/11/17 1600   06/11/17 1558  HIV antibody  Once,   R     06/11/17 1600   06/11/17 1557  Culture, blood (routine x 2)  BLOOD CULTURE X 2,   R     06/11/17 1600   Signed and Held  Gram stain  Once,   R     Signed and Held   Signed and Held  Strep pneumoniae urinary antigen  Once,   R     Signed and Held   Signed and Held  Creatinine, serum  (enoxaparin (LOVENOX)    CrCl >/= 30 ml/min)  Weekly,   R    Comments:  while on enoxaparin therapy    Signed and Held      Vitals/Pain Today's Vitals   06/11/17 1546 06/11/17 1547 06/11/17 1815 06/11/17 1857  BP:  135/72 131/67  Pulse:  94 96   Resp:  18 17   Temp:  98.7 F (37.1 C)    TempSrc:  Oral    SpO2:  96% 95%   Weight:      Height:      PainSc: 3    2     Isolation Precautions No active isolations  Medications Medications  albuterol (PROVENTIL) (2.5 MG/3ML) 0.083% nebulizer solution 5 mg (5 mg Nebulization Not Given 06/11/17 1032)  piperacillin-tazobactam (ZOSYN) IVPB 3.375 g (3.375 g Intravenous New Bag/Given 06/11/17 1922)  0.9 %  sodium chloride infusion ( Intravenous New Bag/Given 06/11/17 1913)  HYDROcodone-acetaminophen (NORCO/VICODIN) 5-325 MG per tablet 1-2 tablet (has no administration in time range)  cefTRIAXone (ROCEPHIN) 1 g in sodium chloride 0.9 % 100 mL IVPB (0 g Intravenous Stopped 06/11/17 1321)  azithromycin (ZITHROMAX) 500 mg in sodium chloride 0.9 % 250 mL IVPB (0 mg Intravenous Stopped 06/11/17 1348)  albuterol (PROVENTIL) (2.5 MG/3ML) 0.083% nebulizer solution 5 mg (5 mg Nebulization Given 06/11/17 1242)  ipratropium (ATROVENT) nebulizer solution 0.5 mg (0.5 mg Nebulization Given 06/11/17 1242)  iopamidol (ISOVUE-300) 61 % injection 75 mL (75 mLs Intravenous Contrast Given 06/11/17 1413)    Mobility walks

## 2017-06-12 DIAGNOSIS — J869 Pyothorax without fistula: Principal | ICD-10-CM

## 2017-06-12 DIAGNOSIS — J181 Lobar pneumonia, unspecified organism: Secondary | ICD-10-CM

## 2017-06-12 LAB — MRSA PCR SCREENING: MRSA by PCR: NEGATIVE

## 2017-06-12 LAB — EXPECTORATED SPUTUM ASSESSMENT W GRAM STAIN, RFLX TO RESP C

## 2017-06-12 LAB — HIV ANTIBODY (ROUTINE TESTING W REFLEX): HIV SCREEN 4TH GENERATION: NONREACTIVE

## 2017-06-12 LAB — EXPECTORATED SPUTUM ASSESSMENT W REFEX TO RESP CULTURE

## 2017-06-12 LAB — STREP PNEUMONIAE URINARY ANTIGEN: Strep Pneumo Urinary Antigen: NEGATIVE

## 2017-06-12 MED ORDER — SODIUM CHLORIDE 0.9 % IV SOLN
3.0000 g | Freq: Four times a day (QID) | INTRAVENOUS | Status: DC
  Administered 2017-06-12 – 2017-06-18 (×23): 3 g via INTRAVENOUS
  Filled 2017-06-12 (×30): qty 3

## 2017-06-12 NOTE — Progress Notes (Signed)
Called to give report to [place] on 2W at [facility]. Setting up [facility] for transport.

## 2017-06-12 NOTE — Progress Notes (Signed)
When pt arrived to unit, explained to him the use of the hat in the toilet to collect a urine sample. He did not understand and he urinated around the hat. Explained again and he now understands more clearly. Awaiting a sample to send to lab.

## 2017-06-12 NOTE — Consult Note (Signed)
Name: Andrew Mueller MRN: [id number] DOB: February 09, 1980    ADMISSION DATE:  06/11/2017 CONSULTATION DATE:   5/30  REFERRING MD :  Mal Misty   CHIEF COMPLAINT:  Loculated pleural effusion    History of present illness: This is a 37 year old male patient with a 1 pack/day smoking history otherwise no significant medical history presented to the emergency room on 5/18 with chief complaint of cough, pleuritic chest pain, and low-grade fevers.  Initial onset of illness around the first week of May, was seen in the emergency room with chief complaint of cough, pleuritic type chest discomfort, and low-grade fevers.  CT of chest obtained at that point showed a very small right lower lobe/right middle lobe consolidation and small right effusion.  He was discharged from the emergency room with anti-inflammatory medications and narcotics as well as incentive spirometry and instructions to stop smoking.  He did not receive antibiotics.  Over the following week to week and a half his symptoms have continued to worsen.  He continues to have significant pleuritic chest pain on the right worse with cough and deep breath.  He is had low-grade fevers, and cough is been productive of thick yellow-mucus.  He denies sick exposures.  Denies IV drug use.  Does not drink.  Symptoms have been persistent and progressive.  A CT of chest was obtained in the emergency room demonstrates now a moderate size loculated right pleural effusion with air-fluid levels, pleural thickening, and right-sided airspace disease.  Pulmonary was asked to evaluate given concern for empyema.   SIGNIFICANT EVENTS    STUDIES:   CT chest 5/29: 1. Septal thickening, peribronchovascular nodularity/consolidation and loculated collections of pleural fluid/air in the right hemithorax, largely new from 05/31/2017 and most indicative of bronchopneumonia with empyema. Difficult to definitively exclude a bronchopleural fistula. 2. New  mediastinal/subcarinal and right hilar adenopathy, likely reactive. 3. Liver may be steatotic.  HISTORY OF PRESENT ILLNESS:  As above   PAST MEDICAL HISTORY :   has no past medical history on file.  has a past surgical history that includes Hand surgery. Prior to Admission medications   Medication Sig Start Date End Date Taking? Authorizing Provider  meloxicam (MOBIC) 15 MG tablet Take 1 tablet (15 mg total) by mouth daily. 05/31/17  Yes Harris, Abigail, PA-C  oxyCODONE-acetaminophen (PERCOCET) 5-325 MG tablet Take 1-2 tablets by mouth every 4 (four) hours as needed. 05/31/17  Yes Harris, Abigail, PA-C  predniSONE (STERAPRED UNI-PAK 21 TAB) 10 MG (21) TBPK tablet Use as directed 06/02/17  Yes Harris, Abigail, PA-C  azithromycin (ZITHROMAX Z-PAK) 250 MG tablet 2 po day one, then 1 daily x 4 days Patient not taking: Reported on 06/11/2017 11/26/14   Arthor Captain, PA-C  guaiFENesin-codeine 100-10 MG/5ML syrup Take 5-10 mLs by mouth every 6 (six) hours as needed for cough. Patient not taking: Reported on 06/11/2017 11/26/14   Arthor Captain, PA-C  HYDROcodone-acetaminophen (NORCO/VICODIN) 5-325 MG per tablet Take 1 tablet by mouth every 6 (six) hours as needed. Patient not taking: Reported on 06/11/2017 05/23/14   Bethann Berkshire, MD  naproxen (NAPROSYN) 500 MG tablet Take 1 tablet (500 mg total) by mouth 2 (two) times daily. Patient not taking: Reported on 06/11/2017 05/23/14   Bethann Berkshire, MD   No Known Allergies  FAMILY HISTORY:  family history includes Diabetes in his father and mother. SOCIAL HISTORY:  reports that he has been smoking cigarettes.  He has a 20.00 pack-year smoking history. He has never used smokeless tobacco. He  reports that he does not drink alcohol or use drugs.  REVIEW OF SYSTEMS:   Constitutional: + fever, chills, weight loss, + malaise/fatigue and diaphoresis.  HENT: Negative for hearing loss, ear pain, nosebleeds, congestion, sore throat, neck pain, tinnitus and ear  discharge.   Eyes: Negative for blurred vision, double vision, photophobia, pain, discharge and redness.  Respiratory: + cough,- hemoptysis, sputum production thick foul-smelling gray-yellow, shortness of breath, wheezing and stridor.  Pleuritic type chest pain over the right chest.  Worse with deep breath and significantly worse with cough Cardiovascular: -chest pain, palpitations, orthopnea, claudication, leg swelling and PND.  Gastrointestinal: Negative for heartburn, nausea, vomiting, abdominal pain, diarrhea, constipation, blood in stool and melena.  Genitourinary: Negative for dysuria, urgency, frequency, hematuria and flank pain.  Musculoskeletal: Negative for myalgias, back pain, joint pain and falls.  Skin: Negative for itching and rash.  Neurological: Negative for dizziness, tingling, tremors, sensory change, speech change, focal weakness, seizures, loss of consciousness, weakness and headaches.  Endo/Heme/Allergies: Negative for environmental allergies and polydipsia. Does not bruise/bleed easily.  SUBJECTIVE:  No acute distress VITAL SIGNS: Temp:  [98.7 F (37.1 C)-99.1 F (37.3 C)] 99.1 F (37.3 C) (05/30 0438) Pulse Rate:  [92-99] 92 (05/30 0438) Resp:  [17-20] 19 (05/30 0438) BP: (127-156)/(67-94) 127/78 (05/30 0438) SpO2:  [94 %-99 %] 94 % (05/30 0438) Weight:  [253 lb 12 oz (115.1 kg)] 253 lb 12 oz (115.1 kg) (05/29 2100)  PHYSICAL EXAMINATION: General: 37 year old white male currently resting comfortably in bed he is in no acute distress Neuro: Awake alert oriented no focal deficits HEENT: Normocephalic atraumatic no jugular venous distention Cardiovascular: Regular rate and rhythm Lungs: Wheeze diminished right base greater than left no accessory use Abdomen: Soft nontender no organomegaly Musculoskeletal: Full strength and bulk Skin: Warm and dry no edema  Recent Labs  Lab 06/11/17 1229  NA 133*  K 4.0  CL 94*  BUN 12  CREATININE 0.80  GLUCOSE 119*    Recent Labs  Lab 06/11/17 1221 06/11/17 1229  HGB 13.1 13.9  HCT 38.4* 41.0  WBC 13.2*  --   PLT 382  --    Dg Chest 2 View  Result Date: 06/11/2017 CLINICAL DATA:  Right lower chest pain, shortness of breath, cough EXAM: CHEST - 2 VIEW COMPARISON:  05/31/2017 FINDINGS: There is airspace consolidation noted in the inferior right upper lobe and right lower lobe, worsening since prior study. Air-fluid level is noted peripherally in the right lower hemithorax which could reflect abscess or bronchopleural fistula. Left lung clear. Heart is normal size. No acute bony abnormality. IMPRESSION: Worsening airspace consolidation in the right lower lobe and inferior right upper lobe concerning for pneumonia. Short air-fluid level noted peripherally at the right lung base could be parenchymal or pleural and represent abscess or bronchopleural fistula. Recommend further evaluation with chest CT with IV contrast. Electronically Signed   By: Charlett Nose M.D.   On: 06/11/2017 09:09   Ct Chest W Contrast  Result Date: 06/11/2017 CLINICAL DATA:  Shortness of breath, right lateral chest pain for 1 month, and fever. EXAM: CT CHEST WITH CONTRAST TECHNIQUE: Multidetector CT imaging of the chest was performed during intravenous contrast administration. CONTRAST:  75mL ISOVUE-300 IOPAMIDOL (ISOVUE-300) INJECTION 61% COMPARISON:  05/31/2017. FINDINGS: Cardiovascular: Vascular structures are unremarkable. Heart size normal. No pericardial effusion. Mediastinum/Nodes: Mediastinal lymph nodes measure up to 1.6 cm in the low right paratracheal station, previously 9 mm. Subcarinal lymph node measures 1.7 cm, previously 12 mm. Right hilar  adenopathy measures up to 1.6 cm, previously 11 mm. No axillary adenopathy. Esophagus is grossly unremarkable. Lungs/Pleura: Septal thickening, peribronchovascular nodularity/consolidation and volume loss in the right middle and right lower lobes are largely new from 05/31/2017. In addition,  there are sizable loculated collections of pleural fluid and air in the right hemithorax, also largely new from 05/31/2017. Probable subpleural lymph nodes along the minor fissure. Left lung is clear. Debris is seen dependently in the trachea. Upper Abdomen: Liver may be slightly decreased in attenuation diffusely. Visualized portions of the liver, gallbladder, adrenal glands, kidneys, spleen, pancreas stomach and bowel are otherwise grossly unremarkable. There are several upper abdominal lymph nodes which measure up to 10 mm in the gastrohepatic ligament. Musculoskeletal: Degenerative changes in the spine. IMPRESSION: 1. Septal thickening, peribronchovascular nodularity/consolidation and loculated collections of pleural fluid/air in the right hemithorax, largely new from 05/31/2017 and most indicative of bronchopneumonia with empyema. Difficult to definitively exclude a bronchopleural fistula. 2. New mediastinal/subcarinal and right hilar adenopathy, likely reactive. 3. Liver may be steatotic. Electronically Signed   By: Leanna Battles M.D.   On: 06/11/2017 14:51    ASSESSMENT / PLAN:  Community-acquired pneumonia with associated right-sided loculated pleural effusion with pleural thickening, and air-fluid level.  Suspect this represents empyema Plan Continue current antibiotic therapy; day #2 Rocephin and azithromycin I have contacted thoracic surgery, he needs video-assisted thoracotomy for evacuation of pleural space Incentive spirometry Pulse oximetry Will need transfer to [facility]  Tobacco abuse Plan Continue tobacco cessation counseling    06/12/2017, 9:33 AM

## 2017-06-12 NOTE — Progress Notes (Signed)
PROGRESS NOTE [facility]   MCKALE HAFFEY   ZOX:[id number] DOB: [birth year]  DOA: 06/11/2017 PCP: Patient, No Pcp Per   Brief Narrative:  Andrew Mueller is a 37 year old male with no significant medical history who presented to the emergency department complaining of right side chest pain and persistent cough.  Symptoms developed about 4 weeks ago with worsening for the past week.  Upon ED evaluation chest x-ray shows right lower lobe consolidation concerning for pneumonia and also noted air fluid level concerning for pleural effusion.  CT of the chest was obtained which showed large loculated pleural effusion.  Patient was admitted with working diagnosis of pneumonia with empyema.  PCCM was consulted and recommended VATS.  Subjective: Patient seen and examined, continues to have right side chest pain (pleurisy), cough has increased this morning.  Afebrile.  Shortness of breath has improved.  Assessment & Plan: Right lower lobe pneumonia (CAP) with empyema PCCM recommendations appreciated, patient has been placed on Unasyn.  Recommendations for VATS.  PCCM discussed case with cardiothoracic surgeon who requested patient to be transferred to [facility].  Follow-up sputum and blood cultures.  Continue supportive treatment. Albuterol as needed.   Tobacco abuse Tobacco cessation discussed Nicotine patch  DVT prophylaxis: Lovenox Code Status: Full code Family Communication: None at bedside Disposition Plan: Transfer to [facility] for VATS  Consultants:   PCCM   Procedures:   None   Antimicrobials: Anti-infectives (From admission, onward)   Start     Dose/Rate Route Frequency Ordered Stop   06/12/17 1200  cefTRIAXone (ROCEPHIN) 2 g in sodium chloride 0.9 % 100 mL IVPB  Status:  Discontinued     2 g 200 mL/hr over 30 Minutes Intravenous Every 24 hours 06/11/17 2000 06/12/17 1055   06/12/17 1200  azithromycin (ZITHROMAX) tablet 500 mg  Status:  Discontinued     500 mg  Oral Every 24 hours 06/11/17 2000 06/12/17 1055   06/12/17 1200  Ampicillin-Sulbactam (UNASYN) 3 g in sodium chloride 0.9 % 100 mL IVPB     3 g 200 mL/hr over 30 Minutes Intravenous Every 6 hours 06/12/17 1111     06/11/17 1800  piperacillin-tazobactam (ZOSYN) IVPB 3.375 g  Status:  Discontinued     3.375 g 12.5 mL/hr over 240 Minutes Intravenous Every 8 hours 06/11/17 1620 06/11/17 2000   06/11/17 1215  cefTRIAXone (ROCEPHIN) 1 g in sodium chloride 0.9 % 100 mL IVPB     1 g 200 mL/hr over 30 Minutes Intravenous  Once 06/11/17 1202 06/11/17 1321   06/11/17 1215  azithromycin (ZITHROMAX) 500 mg in sodium chloride 0.9 % 250 mL IVPB     500 mg 250 mL/hr over 60 Minutes Intravenous  Once 06/11/17 1202 06/11/17 1348       Objective: Vitals:   06/11/17 2002 06/11/17 2100 06/12/17 0438 06/12/17 1211  BP: (!) 147/81  127/78   Pulse: 97  92 92  Resp: Temp: 99.1 F (37.3 C)  99.1 F (37.3 C)   TempSrc: Oral  Oral   SpO2: 95%  94% 99%  Weight:  115.1 kg (253 lb 12 oz)    Height:   (1.905 m)      Intake/Output Summary (Last 24 hours) at 06/12/2017 1240 Last data filed at 06/12/2017 0329 Gross per 24 hour  Intake 826.67 ml  Output -  Net 826.67 ml   Filed Weights   06/11/17 0757 06/11/17 2100  Weight: 117.9 kg (260 lb)  115.1 kg (253 lb 12 oz)    Examination:  General exam: Appears calm and comfortable  HEENT: AC/AT, PERRLA, OP moist and clear Respiratory system: Diminished breath sounds in the right, diffuse wheezing on the left, productive cough.  Normal respiratory effort Cardiovascular system: S1 & S2 heard, RRR. No JVD, murmurs, rubs or gallops Gastrointestinal system: Abdomen is nondistended, soft and nontender.  Central nervous system: Alert and oriented. No focal neurological deficits. Extremities: No pedal edema. Skin: No rashes, lesions or ulcers Psychiatry: Judgement and insight appear normal. Mood & affect appropriate.    Data Reviewed: I have  personally reviewed following labs and imaging studies  CBC: Recent Labs  Lab 06/11/17 1221 06/11/17 1229  WBC 13.2*  --   NEUTROABS 10.1*  --   HGB 13.1 13.9  HCT 38.4* 41.0  MCV 96.2  --   PLT 382  --    Basic Metabolic Panel: Recent Labs  Lab 06/11/17 1229  NA 133*  K 4.0  CL 94*  GLUCOSE 119*  BUN 12  CREATININE 0.80   GFR: Estimated Creatinine Clearance: 171.2 mL/min (by C-G formula based on SCr of 0.8 mg/dL). Liver Function Tests: No results for input(s): AST, ALT, ALKPHOS, BILITOT, PROT, ALBUMIN in the last 168 hours. No results for input(s): LIPASE, AMYLASE in the last 168 hours. No results for input(s): AMMONIA in the last 168 hours. Coagulation Profile: No results for input(s): INR, PROTIME in the last 168 hours. Cardiac Enzymes: No results for input(s): CKTOTAL, CKMB, CKMBINDEX, TROPONINI in the last 168 hours. BNP (last 3 results) No results for input(s): PROBNP in the last 8760 hours. HbA1C: No results for input(s): HGBA1C in the last 72 hours. CBG: No results for input(s): GLUCAP in the last 168 hours. Lipid Profile: No results for input(s): CHOL, HDL, LDLCALC, TRIG, CHOLHDL, LDLDIRECT in the last 72 hours. Thyroid Function Tests: No results for input(s): TSH, T4TOTAL, FREET4, T3FREE, THYROIDAB in the last 72 hours. Anemia Panel: No results for input(s): VITAMINB12, FOLATE, FERRITIN, TIBC, IRON, RETICCTPCT in the last 72 hours. Sepsis Labs: Recent Labs  Lab 06/11/17 1229 06/11/17 1915  PROCALCITON  --  0.63  LATICACIDVEN 1.25  --     Recent Results (from the past 240 hour(s))  MRSA PCR Screening     Status: None   Collection Time: 06/12/17  4:34 AM  Result Value Ref Range Status   MRSA by PCR NEGATIVE NEGATIVE Final    Comment:        The GeneXpert MRSA Assay (FDA approved for NASAL specimens only), is one component of a comprehensive MRSA colonization surveillance program. It is not intended to diagnose MRSA infection nor to guide  or monitor treatment for MRSA infections. Performed at [facility], [address] [place], [place] [address]   Culture, expectorated sputum-assessment     Status: None   Collection Time: 06/12/17  9:45 AM  Result Value Ref Range Status   Specimen Description SPUTUM  Final   Special Requests NONE  Final   Sputum evaluation   Final    THIS SPECIMEN IS ACCEPTABLE FOR SPUTUM CULTURE Performed at [facility], [address] [place], [place] [address]    Report Status 06/12/2017 FINAL  Final      Radiology Studies: Dg Chest 2 View  Result Date: 06/11/2017 CLINICAL DATA:  Right lower chest pain, shortness of breath, cough EXAM: CHEST - 2 VIEW COMPARISON:  05/31/2017 FINDINGS: There is airspace consolidation noted in the inferior  right upper lobe and right lower lobe, worsening since prior study. Air-fluid level is noted peripherally in the right lower hemithorax which could reflect abscess or bronchopleural fistula. Left lung clear. Heart is normal size. No acute bony abnormality. IMPRESSION: Worsening airspace consolidation in the right lower lobe and inferior right upper lobe concerning for pneumonia. Short air-fluid level noted peripherally at the right lung base could be parenchymal or pleural and represent abscess or bronchopleural fistula. Recommend further evaluation with chest CT with IV contrast. Electronically Signed   By: Charlett Nose M.D.   On: 06/11/2017 09:09   Ct Chest W Contrast  Result Date: 06/11/2017 CLINICAL DATA:  Shortness of breath, right lateral chest pain for 1 month, and fever. EXAM: CT CHEST WITH CONTRAST TECHNIQUE: Multidetector CT imaging of the chest was performed during intravenous contrast administration. CONTRAST:  75mL ISOVUE-300 IOPAMIDOL (ISOVUE-300) INJECTION 61% COMPARISON:  05/31/2017. FINDINGS: Cardiovascular: Vascular structures are unremarkable. Heart size normal. No pericardial effusion. Mediastinum/Nodes:  Mediastinal lymph nodes measure up to 1.6 cm in the low right paratracheal station, previously 9 mm. Subcarinal lymph node measures 1.7 cm, previously 12 mm. Right hilar adenopathy measures up to 1.6 cm, previously 11 mm. No axillary adenopathy. Esophagus is grossly unremarkable. Lungs/Pleura: Septal thickening, peribronchovascular nodularity/consolidation and volume loss in the right middle and right lower lobes are largely new from 05/31/2017. In addition, there are sizable loculated collections of pleural fluid and air in the right hemithorax, also largely new from 05/31/2017. Probable subpleural lymph nodes along the minor fissure. Left lung is clear. Debris is seen dependently in the trachea. Upper Abdomen: Liver may be slightly decreased in attenuation diffusely. Visualized portions of the liver, gallbladder, adrenal glands, kidneys, spleen, pancreas stomach and bowel are otherwise grossly unremarkable. There are several upper abdominal lymph nodes which measure up to 10 mm in the gastrohepatic ligament. Musculoskeletal: Degenerative changes in the spine. IMPRESSION: 1. Septal thickening, peribronchovascular nodularity/consolidation and loculated collections of pleural fluid/air in the right hemithorax, largely new from 05/31/2017 and most indicative of bronchopneumonia with empyema. Difficult to definitively exclude a bronchopleural fistula. 2. New mediastinal/subcarinal and right hilar adenopathy, likely reactive. 3. Liver may be steatotic. Electronically Signed   By: Leanna Battles M.D.   On: 06/11/2017 14:51    Scheduled Meds: . enoxaparin (LOVENOX) injection  40 mg Subcutaneous Q24H  . nicotine  7 mg Transdermal Daily   Continuous Infusions: . sodium chloride 100 mL/hr at 06/12/17 0934  . ampicillin-sulbactam (UNASYN) IV Stopped (06/12/17 1217)     LOS: 1 day   Time spent: Total of 35 minutes spent with pt, greater than 50% of which was spent in discussion of  treatment, counseling and  coordination of care   Latrelle Dodrill, MD Pager: Text Page via [email or web address]   If 7PM-7AM, please contact night-coverage [email or web address] 06/12/2017, 12:40 PM   Note - This record has been created using [employer]. Chart creation errors have been sought, but may not always have been located. Such creation errors do not reflect on the standard of medical care.

## 2017-06-12 NOTE — Progress Notes (Signed)
Pharmacy Antibiotic Note  Andrew Mueller is a 37 y.o. male admitted on 06/11/2017 with empyema.  Pharmacy has been consulted for Unasyn dosing.  Plan:  Unasyn 3 gr IV q6h  Do not anticipate need for renal dose adjustment, pharmacy will sign-off  Height:  (190.5 cm) Weight: 253 lb 12 oz (115.1 kg) IBW/kg (Calculated) : 84.5  Temp (24hrs), Avg:99 F (37.2 C), Min:98.7 F (37.1 C), Max:99.1 F (37.3 C)  Recent Labs  Lab 06/11/17 1221 06/11/17 1229  WBC 13.2*  --   CREATININE  --  0.80  LATICACIDVEN  --  1.25    Estimated Creatinine Clearance: 171.2 mL/min (by C-G formula based on SCr of 0.8 mg/dL).    No Known Allergies  Antimicrobials this admission: 5/29 Ceftriaxone/azithromycin >> 5/20 5/29 zosyn x 1  5/30 Unasyn >>  Dose adjustments this admission:  Microbiology results: 5/29 MRSA PCR:  negative  5/29 Sputum cx:  5/29 Bcx (drawn after ceftriaxone/azith x1) 5/30 resp culture: IP  Thank you for allowing pharmacy to be a part of this patient's care.   Adalberto Cole, PharmD, BCPS Pager [phone] 06/12/2017 11:12 AM

## 2017-06-13 ENCOUNTER — Encounter (HOSPITAL_COMMUNITY)
Admission: EM | Disposition: A | Discharge status: Home / Self Care | Payer: Self-pay | Source: Home / Self Care | Attending: Internal Medicine

## 2017-06-13 ENCOUNTER — Inpatient Hospital Stay (HOSPITAL_COMMUNITY): Payer: Self-pay | Admitting: Certified Registered Nurse Anesthetist

## 2017-06-13 ENCOUNTER — Inpatient Hospital Stay (HOSPITAL_COMMUNITY): Payer: Self-pay

## 2017-06-13 DIAGNOSIS — J869 Pyothorax without fistula: Secondary | ICD-10-CM | POA: Diagnosis present

## 2017-06-13 HISTORY — PX: THORACOTOMY: SHX5074

## 2017-06-13 HISTORY — PX: VIDEO ASSISTED THORACOSCOPY (VATS)/EMPYEMA: SHX6172

## 2017-06-13 LAB — TYPE AND SCREEN
ABO/RH(D): A NEG
Antibody Screen: NEGATIVE

## 2017-06-13 LAB — GLUCOSE, CAPILLARY
Glucose-Capillary: 165 mg/dL — ABNORMAL HIGH (ref 65–99)
Glucose-Capillary: 141 mg/dL — ABNORMAL HIGH (ref 65–99)

## 2017-06-13 LAB — ABO/RH: ABO/RH(D): A NEG

## 2017-06-13 SURGERY — VIDEO ASSISTED THORACOSCOPY (VATS)/EMPYEMA
Anesthesia: General | Site: Chest | Laterality: Right

## 2017-06-13 MED ORDER — SODIUM CHLORIDE 0.9% FLUSH: 9.0000 mL | INTRAVENOUS | Status: DC | PRN

## 2017-06-13 MED ORDER — POTASSIUM CHLORIDE 10 MEQ/50ML IV SOLN: 10.0000 meq | Freq: Every day | INTRAVENOUS | Status: DC | PRN

## 2017-06-13 MED ORDER — PNEUMOCOCCAL VAC POLYVALENT 25 MCG/0.5ML IJ INJ
0.5000 mL | INJECTION | INTRAMUSCULAR | Status: DC | PRN
Start: 2017-06-13 — End: 2017-06-18

## 2017-06-13 MED ORDER — DEXTROSE-NACL 5-0.9 % IV SOLN
INTRAVENOUS | Status: DC
  Administered 2017-06-13: 125 mL/h via INTRAVENOUS
  Administered 2017-06-14 (×2): via INTRAVENOUS

## 2017-06-13 MED ORDER — OXYCODONE HCL 5 MG PO TABS
ORAL_TABLET | ORAL | Status: AC
  Filled 2017-06-13: qty 2

## 2017-06-13 MED ORDER — KETOROLAC TROMETHAMINE 15 MG/ML IJ SOLN
INTRAMUSCULAR | Status: AC
  Filled 2017-06-13: qty 1

## 2017-06-13 MED ORDER — ARTIFICIAL TEARS OPHTHALMIC OINT
TOPICAL_OINTMENT | OPHTHALMIC | Status: DC | PRN
  Administered 2017-06-13: 1 via OPHTHALMIC

## 2017-06-13 MED ORDER — NALOXONE HCL 0.4 MG/ML IJ SOLN: 0.4000 mg | INTRAMUSCULAR | Status: DC | PRN

## 2017-06-13 MED ORDER — FENTANYL CITRATE (PF) 100 MCG/2ML IJ SOLN
INTRAMUSCULAR | Status: DC | PRN
  Administered 2017-06-13: 25 ug via INTRAVENOUS
  Administered 2017-06-13: 50 ug via INTRAVENOUS
  Administered 2017-06-13: 25 ug via INTRAVENOUS
  Administered 2017-06-13: 150 ug via INTRAVENOUS
  Administered 2017-06-13: 50 ug via INTRAVENOUS
  Administered 2017-06-13 (×2): 100 ug via INTRAVENOUS

## 2017-06-13 MED ORDER — 0.9 % SODIUM CHLORIDE (POUR BTL) OPTIME
TOPICAL | Status: DC | PRN
  Administered 2017-06-13: 2000 mL

## 2017-06-13 MED ORDER — ONDANSETRON HCL 4 MG/2ML IJ SOLN
INTRAMUSCULAR | Status: DC | PRN
  Administered 2017-06-13: 4 mg via INTRAVENOUS

## 2017-06-13 MED ORDER — MIDAZOLAM HCL 2 MG/2ML IJ SOLN
INTRAMUSCULAR | Status: AC
  Filled 2017-06-13: qty 2

## 2017-06-13 MED ORDER — ACETAMINOPHEN 160 MG/5ML PO SOLN
1000.0000 mg | Freq: Four times a day (QID) | ORAL | Status: DC
  Administered 2017-06-17: 1000 mg via ORAL
  Filled 2017-06-13 (×2): qty 40.6

## 2017-06-13 MED ORDER — HYDROMORPHONE HCL 2 MG/ML IJ SOLN: 0.2500 mg | INTRAMUSCULAR | Status: DC | PRN

## 2017-06-13 MED ORDER — KETOROLAC TROMETHAMINE 15 MG/ML IJ SOLN
15.0000 mg | Freq: Four times a day (QID) | INTRAMUSCULAR | Status: AC | PRN
  Administered 2017-06-13 – 2017-06-14 (×5): 15 mg via INTRAVENOUS
  Filled 2017-06-13 (×4): qty 1

## 2017-06-13 MED ORDER — BISACODYL 5 MG PO TBEC
10.0000 mg | DELAYED_RELEASE_TABLET | Freq: Every day | ORAL | Status: DC
  Administered 2017-06-13 – 2017-06-17 (×5): 10 mg via ORAL
  Filled 2017-06-13 (×7): qty 2

## 2017-06-13 MED ORDER — ACETAMINOPHEN 500 MG PO TABS
1000.0000 mg | ORAL_TABLET | Freq: Four times a day (QID) | ORAL | Status: DC
  Administered 2017-06-13 – 2017-06-18 (×17): 1000 mg via ORAL
  Filled 2017-06-13 (×20): qty 2

## 2017-06-13 MED ORDER — TRAMADOL HCL 50 MG PO TABS
50.0000 mg | ORAL_TABLET | Freq: Four times a day (QID) | ORAL | Status: DC | PRN
  Administered 2017-06-14 – 2017-06-17 (×2): 100 mg via ORAL
  Filled 2017-06-13 (×2): qty 2

## 2017-06-13 MED ORDER — LACTATED RINGERS IV SOLN
INTRAVENOUS | Status: DC
  Administered 2017-06-13: 50 mL/h via INTRAVENOUS

## 2017-06-13 MED ORDER — HYDROMORPHONE HCL 2 MG/ML IJ SOLN
0.2500 mg | INTRAMUSCULAR | Status: DC | PRN
  Administered 2017-06-13 (×4): 0.5 mg via INTRAVENOUS

## 2017-06-13 MED ORDER — SODIUM CHLORIDE 0.9 % IV SOLN
2.0000 g | INTRAVENOUS | Status: DC
  Filled 2017-06-13: qty 20

## 2017-06-13 MED ORDER — HYDROMORPHONE HCL 2 MG/ML IJ SOLN
INTRAMUSCULAR | Status: AC
Start: 2017-06-13 — End: 2017-06-13
  Administered 2017-06-13: 0.5 mg via INTRAVENOUS
  Filled 2017-06-13: qty 1

## 2017-06-13 MED ORDER — SENNOSIDES-DOCUSATE SODIUM 8.6-50 MG PO TABS
1.0000 | ORAL_TABLET | Freq: Every day | ORAL | Status: DC
  Administered 2017-06-13 – 2017-06-17 (×5): 1 via ORAL
  Filled 2017-06-13 (×5): qty 1

## 2017-06-13 MED ORDER — FENTANYL CITRATE (PF) 250 MCG/5ML IJ SOLN
INTRAMUSCULAR | Status: AC
  Filled 2017-06-13: qty 5

## 2017-06-13 MED ORDER — PROPOFOL 10 MG/ML IV BOLUS
INTRAVENOUS | Status: AC
  Filled 2017-06-13: qty 40

## 2017-06-13 MED ORDER — LIDOCAINE HCL (CARDIAC) PF 100 MG/5ML IV SOSY
PREFILLED_SYRINGE | INTRAVENOUS | Status: DC | PRN
  Administered 2017-06-13: 100 mg via INTRAVENOUS

## 2017-06-13 MED ORDER — ROCURONIUM BROMIDE 100 MG/10ML IV SOLN
INTRAVENOUS | Status: DC | PRN
  Administered 2017-06-13: 10 mg via INTRAVENOUS
  Administered 2017-06-13: 50 mg via INTRAVENOUS
  Administered 2017-06-13: 20 mg via INTRAVENOUS

## 2017-06-13 MED ORDER — SUGAMMADEX SODIUM 200 MG/2ML IV SOLN
INTRAVENOUS | Status: DC | PRN
  Administered 2017-06-13: 231.2 mg via INTRAVENOUS

## 2017-06-13 MED ORDER — INSULIN ASPART 100 UNIT/ML ~~LOC~~ SOLN
0.0000 [IU] | SUBCUTANEOUS | Status: DC
  Administered 2017-06-13 – 2017-06-14 (×4): 4 [IU] via SUBCUTANEOUS
  Administered 2017-06-14: 2 [IU] via SUBCUTANEOUS

## 2017-06-13 MED ORDER — ONDANSETRON HCL 4 MG/2ML IJ SOLN: 4.0000 mg | Freq: Four times a day (QID) | INTRAMUSCULAR | Status: DC | PRN

## 2017-06-13 MED ORDER — MORPHINE SULFATE 2 MG/ML IV SOLN
INTRAVENOUS | Status: DC
  Administered 2017-06-13: 18 mg via INTRAVENOUS
  Administered 2017-06-13 (×2): via INTRAVENOUS
  Administered 2017-06-13: 16.5 mg via INTRAVENOUS
  Administered 2017-06-14: 7.5 mg via INTRAVENOUS
  Administered 2017-06-14 (×2): 6 mg via INTRAVENOUS
  Administered 2017-06-14: 16.5 mg via INTRAVENOUS
  Administered 2017-06-14: 6 mg via INTRAVENOUS
  Administered 2017-06-15: 9 mg via INTRAVENOUS
  Administered 2017-06-15: 06:00:00 via INTRAVENOUS
  Administered 2017-06-16: 1.5 mg via INTRAVENOUS
  Administered 2017-06-16: 2 mg via INTRAVENOUS
  Administered 2017-06-16: 1.5 mg via INTRAVENOUS
  Administered 2017-06-16: 3 mg via INTRAVENOUS
  Administered 2017-06-16: 1.5 mg via INTRAVENOUS
  Filled 2017-06-13 (×2): qty 30
  Filled 2017-06-13: qty 50
  Filled 2017-06-13: qty 30

## 2017-06-13 MED ORDER — ENOXAPARIN SODIUM 40 MG/0.4ML ~~LOC~~ SOLN
40.0000 mg | Freq: Every day | SUBCUTANEOUS | Status: DC
  Administered 2017-06-14 – 2017-06-17 (×4): 40 mg via SUBCUTANEOUS
  Filled 2017-06-13 (×4): qty 0.4

## 2017-06-13 MED ORDER — DIPHENHYDRAMINE HCL 50 MG/ML IJ SOLN: 12.5000 mg | Freq: Four times a day (QID) | INTRAMUSCULAR | Status: DC | PRN

## 2017-06-13 MED ORDER — DIPHENHYDRAMINE HCL 12.5 MG/5ML PO ELIX
12.5000 mg | ORAL_SOLUTION | Freq: Four times a day (QID) | ORAL | Status: DC | PRN
  Filled 2017-06-13: qty 5

## 2017-06-13 MED ORDER — ONDANSETRON HCL 4 MG/2ML IJ SOLN: 4.0000 mg | Freq: Once | INTRAMUSCULAR | Status: DC | PRN

## 2017-06-13 MED ORDER — SODIUM CHLORIDE 0.9 % IV SOLN
2000.0000 mg | Freq: Once | INTRAVENOUS | Status: AC
  Administered 2017-06-13: 2000 mg via INTRAVENOUS
  Filled 2017-06-13: qty 2000

## 2017-06-13 MED ORDER — OXYCODONE HCL 5 MG PO TABS
5.0000 mg | ORAL_TABLET | ORAL | Status: DC | PRN
  Administered 2017-06-13 – 2017-06-15 (×3): 10 mg via ORAL
  Administered 2017-06-15: 5 mg via ORAL
  Administered 2017-06-16 – 2017-06-18 (×3): 10 mg via ORAL
  Filled 2017-06-13 (×6): qty 2

## 2017-06-13 MED ORDER — FENTANYL CITRATE (PF) 100 MCG/2ML IJ SOLN: 100.0000 ug | Freq: Once | INTRAMUSCULAR | Status: DC

## 2017-06-13 MED ORDER — MIDAZOLAM HCL 2 MG/2ML IJ SOLN
2.0000 mg | Freq: Once | INTRAMUSCULAR | Status: AC
  Administered 2017-06-13: 2 mg via INTRAVENOUS

## 2017-06-13 MED ORDER — DEXTROSE 5 % IV SOLN
2.0000 g | INTRAVENOUS | Status: DC
  Filled 2017-06-13: qty 20

## 2017-06-13 MED ORDER — LABETALOL HCL 5 MG/ML IV SOLN
INTRAVENOUS | Status: AC
  Filled 2017-06-13: qty 4

## 2017-06-13 MED ORDER — LACTATED RINGERS IV SOLN
INTRAVENOUS | Status: DC | PRN
  Administered 2017-06-13: 10:00:00 via INTRAVENOUS

## 2017-06-13 MED ORDER — LABETALOL HCL 5 MG/ML IV SOLN
INTRAVENOUS | Status: DC | PRN
  Administered 2017-06-13: 5 mg via INTRAVENOUS

## 2017-06-13 MED ORDER — ALBUTEROL SULFATE (2.5 MG/3ML) 0.083% IN NEBU
2.5000 mg | INHALATION_SOLUTION | RESPIRATORY_TRACT | Status: DC
  Administered 2017-06-13 – 2017-06-15 (×9): 2.5 mg via RESPIRATORY_TRACT
  Filled 2017-06-13 (×9): qty 3

## 2017-06-13 MED ORDER — PROPOFOL 10 MG/ML IV BOLUS
INTRAVENOUS | Status: DC | PRN
  Administered 2017-06-13: 50 mg via INTRAVENOUS
  Administered 2017-06-13: 30 mg via INTRAVENOUS
  Administered 2017-06-13: 150 mg via INTRAVENOUS

## 2017-06-13 MED ORDER — VANCOMYCIN HCL IN DEXTROSE 1-5 GM/200ML-% IV SOLN
1000.0000 mg | Freq: Three times a day (TID) | INTRAVENOUS | Status: DC
  Administered 2017-06-14 – 2017-06-16 (×8): 1000 mg via INTRAVENOUS
  Filled 2017-06-13 (×11): qty 200

## 2017-06-13 MED ORDER — FENTANYL CITRATE (PF) 100 MCG/2ML IJ SOLN
INTRAMUSCULAR | Status: AC
  Administered 2017-06-13: 100 ug
  Filled 2017-06-13: qty 2

## 2017-06-13 MED ORDER — MEPERIDINE HCL 50 MG/ML IJ SOLN: 6.2500 mg | INTRAMUSCULAR | Status: DC | PRN

## 2017-06-13 SURGICAL SUPPLY — 72 items
ADH SKN CLS APL DERMABOND .7 (GAUZE/BANDAGES/DRESSINGS) ×1
APL SKNCLS STERI-STRIP NONHPOA (GAUZE/BANDAGES/DRESSINGS)
APL SRG 22X2 LUM MLBL SLNT (VASCULAR PRODUCTS)
APL SRG 7X2 LUM MLBL SLNT (VASCULAR PRODUCTS)
APPLICATOR TIP COSEAL (VASCULAR PRODUCTS) IMPLANT
APPLICATOR TIP EXT COSEAL (VASCULAR PRODUCTS) IMPLANT
BENZOIN TINCTURE PRP APPL 2/3 (GAUZE/BANDAGES/DRESSINGS) IMPLANT
CANISTER SUCT 3000ML PPV (MISCELLANEOUS) ×3 IMPLANT
CATH KIT ON Q 5IN SLV (PAIN MANAGEMENT) IMPLANT
CATH THORACIC 28FR (CATHETERS) IMPLANT
CATH THORACIC 36FR (CATHETERS) IMPLANT
CATH THORACIC 36FR RT ANG (CATHETERS) ×1 IMPLANT
CLEANER TIP ELECTROSURG 2X2 (MISCELLANEOUS) ×2 IMPLANT
CONN ST 1/4X3/8  BEN (MISCELLANEOUS) ×1
CONN ST 1/4X3/8 BEN (MISCELLANEOUS) IMPLANT
CONN Y 3/8X3/8X3/8  BEN (MISCELLANEOUS)
CONN Y 3/8X3/8X3/8 BEN (MISCELLANEOUS) IMPLANT
CONT SPEC 4OZ CLIKSEAL STRL BL (MISCELLANEOUS) ×4 IMPLANT
DERMABOND ADVANCED (GAUZE/BANDAGES/DRESSINGS) ×1
DERMABOND ADVANCED .7 DNX12 (GAUZE/BANDAGES/DRESSINGS) IMPLANT
DRAIN CHANNEL 28F RND 3/8 FF (WOUND CARE) IMPLANT
DRAIN CHANNEL 32F RND 10.7 FF (WOUND CARE) ×1 IMPLANT
ELECT BLADE 6.5 EXT (BLADE) ×1 IMPLANT
ELECT REM PT RETURN 9FT ADLT (ELECTROSURGICAL) ×2
ELECTRODE REM PT RTRN 9FT ADLT (ELECTROSURGICAL) ×1 IMPLANT
GAUZE SPONGE 4X4 12PLY STRL (GAUZE/BANDAGES/DRESSINGS) ×3 IMPLANT
GAUZE SPONGE 4X4 12PLY STRL LF (GAUZE/BANDAGES/DRESSINGS) ×1 IMPLANT
GLOVE EUDERMIC 7 POWDERFREE (GLOVE) ×4 IMPLANT
GOWN STRL REUS W/ TWL LRG LVL3 (GOWN DISPOSABLE) ×4 IMPLANT
GOWN STRL REUS W/ TWL XL LVL3 (GOWN DISPOSABLE) ×1 IMPLANT
GOWN STRL REUS W/TWL LRG LVL3 (GOWN DISPOSABLE) ×6
GOWN STRL REUS W/TWL XL LVL3 (GOWN DISPOSABLE) ×2
KIT BASIN OR (CUSTOM PROCEDURE TRAY) ×2 IMPLANT
KIT TURNOVER KIT B (KITS) ×2 IMPLANT
NS IRRIG 1000ML POUR BTL (IV SOLUTION) ×6 IMPLANT
PACK CHEST (CUSTOM PROCEDURE TRAY) ×2 IMPLANT
PAD ARMBOARD 7.5X6 YLW CONV (MISCELLANEOUS) ×4 IMPLANT
SEALANT SURG COSEAL 4ML (VASCULAR PRODUCTS) IMPLANT
SEALANT SURG COSEAL 8ML (VASCULAR PRODUCTS) IMPLANT
SOLUTION ANTI FOG 6CC (MISCELLANEOUS) ×2 IMPLANT
STAPLER VISISTAT 35W (STAPLE) ×1 IMPLANT
SUT PROLENE 3 0 SH DA (SUTURE) IMPLANT
SUT PROLENE 4 0 RB 1 (SUTURE)
SUT PROLENE 4-0 RB1 .5 CRCL 36 (SUTURE) IMPLANT
SUT SILK  1 MH (SUTURE) ×2
SUT SILK 1 MH (SUTURE) ×2 IMPLANT
SUT SILK 1 TIES 10X30 (SUTURE) IMPLANT
SUT SILK 2 0SH CR/8 30 (SUTURE) IMPLANT
SUT SILK 3 0SH CR/8 30 (SUTURE) IMPLANT
SUT VIC AB 1 CTX 18 (SUTURE) IMPLANT
SUT VIC AB 1 CTX 36 (SUTURE) ×2
SUT VIC AB 1 CTX36XBRD ANBCTR (SUTURE) IMPLANT
SUT VIC AB 2-0 CT1 27 (SUTURE) ×2
SUT VIC AB 2-0 CT1 TAPERPNT 27 (SUTURE) IMPLANT
SUT VIC AB 2-0 CTX 36 (SUTURE) ×1 IMPLANT
SUT VIC AB 2-0 UR6 27 (SUTURE) IMPLANT
SUT VIC AB 3-0 MH 27 (SUTURE) IMPLANT
SUT VIC AB 3-0 SH 27 (SUTURE)
SUT VIC AB 3-0 SH 27X BRD (SUTURE) IMPLANT
SUT VIC AB 3-0 X1 27 (SUTURE) ×1 IMPLANT
SUT VICRYL 2 TP 1 (SUTURE) ×1 IMPLANT
SWAB COLLECTION DEVICE MRSA (MISCELLANEOUS) ×1 IMPLANT
SWAB CULTURE ESWAB REG 1ML (MISCELLANEOUS) IMPLANT
SYSTEM SAHARA CHEST DRAIN ATS (WOUND CARE) ×2 IMPLANT
TAPE CLOTH 4X10 WHT NS (GAUZE/BANDAGES/DRESSINGS) ×2 IMPLANT
TAPE CLOTH SURG 4X10 WHT LF (GAUZE/BANDAGES/DRESSINGS) ×1 IMPLANT
TIP APPLICATOR SPRAY EXTEND 16 (VASCULAR PRODUCTS) IMPLANT
TOWEL GREEN STERILE FF (TOWEL DISPOSABLE) ×2 IMPLANT
TRAP SPECIMEN MUCOUS 40CC (MISCELLANEOUS) ×2 IMPLANT
TRAY FOLEY MTR SLVR 14FR STAT (SET/KITS/TRAYS/PACK) ×2 IMPLANT
TUNNELER SHEATH ON-Q 11GX8 DSP (PAIN MANAGEMENT) IMPLANT
WATER STERILE IRR 1000ML POUR (IV SOLUTION) ×3 IMPLANT

## 2017-06-13 NOTE — Progress Notes (Signed)
Spiritual Care Consult Acknowledged. Consult for Advanced Directive was generated 2257 Thurs 06/12/17. Patient in surgery Friday 06/12/17. Chaplain will seek to meet patient and support system to assess AD / HCPOA needs this afternoon.

## 2017-06-13 NOTE — Anesthesia Postprocedure Evaluation (Addendum)
Anesthesia Post Note  Patient: Lemar LivingsKevin L Gatt  Procedure(s) Performed: VIDEO ASSISTED THORACOSCOPY (VATS)/DRAIN EMPYEMA (Right Chest) THORACOTOMY MAJOR (Right Chest)     Patient location during evaluation: PACU Anesthesia Type: General Level of consciousness: awake and alert Pain management: pain level controlled Vital Signs Assessment: post-procedure vital signs reviewed and stable Respiratory status: spontaneous breathing, nonlabored ventilation, respiratory function stable and patient connected to nasal cannula oxygen Cardiovascular status: blood pressure returned to baseline and stable Postop Assessment: no apparent nausea or vomiting Anesthetic complications: no Comments: CVP catheter was directed into axillary vein. I instructed the PACU nurses to pull the catheter.    Last Vitals:  Vitals:   06/13/17 1354 06/13/17 1428  BP: (!) 162/90 (!) 149/90  Pulse: 85   Resp: (!) 25   Temp:  37.1 C  SpO2: 97%     Last Pain:  Vitals:   06/13/17 1428  TempSrc:   PainSc: 7                  Enzley Kitchens,Haiden DAVID

## 2017-06-13 NOTE — Progress Notes (Signed)
Pharmacy Antibiotic Note  Andrew Mueller is a 37 y.o. male admitted on 06/11/2017 with pneumonia.  Pharmacy has been consulted for vancomycin dosing. Pt s/p OR on 5/31 for VATS and empyema drain.  sCr stable ~0.80  Plan: Vancomycin  IV now then  IV every 8 hours.  Goal trough 15-20 mcg/mL.  Vancomycin level as appropriate Monitor clinical progression and LOT  Height:  (190.5 cm) Weight: 254 lb 13.6 oz (115.6 kg) IBW/kg (Calculated) : 84.5  Temp (24hrs), Avg:99.3 F (37.4 C), Min:97.5 F (36.4 C), Max:100.8 F (38.2 C)  Recent Labs  Lab 06/11/17 1221 06/11/17 1229  WBC 13.2*  --   CREATININE  --  0.80  LATICACIDVEN  --  1.25    Estimated Creatinine Clearance: 171.6 mL/min (by C-G formula based on SCr of 0.8 mg/dL).    No Known Allergies  Thank you for allowing pharmacy to be a part of this patient's care.  Andrew Mueller 06/13/2017 4:56 PM

## 2017-06-13 NOTE — Progress Notes (Signed)
Pt is off the unit. Patient went to surgery.

## 2017-06-13 NOTE — Anesthesia Procedure Notes (Signed)
Arterial Line Insertion Start/End5/31/2019 9:30 AM, 06/13/2017 9:35 AM Performed by: Margarita Rana, CRNA, CRNA  Patient location: Pre-op. Preanesthetic checklist: patient identified, IV checked, site marked, risks and benefits discussed, surgical consent, monitors and equipment checked, pre-op evaluation, timeout performed and anesthesia consent Lidocaine 1% used for infiltration Left, radial was placed Catheter size: 20 G Hand hygiene performed  and maximum sterile barriers used  Allen's test indicative of satisfactory collateral circulation Attempts: 2 Procedure performed without using ultrasound guided technique. Following insertion, dressing applied and Biopatch. Post procedure assessment: normal  Patient tolerated the procedure well with no immediate complications.

## 2017-06-13 NOTE — Progress Notes (Signed)
PROGRESS NOTE    Patient: Andrew Mueller     PCP: Patient, No Pcp Per                    DOB: 1980-08-30            DOA: 06/11/2017 ZOX:[id number]             DOS: 06/13/2017, 1:21 PM   LOS: 2 days   Date of Service: The patient was seen and examined on 06/13/2017  Subjective:  OR   06/13/2017, status post 1. Right thoracotomy  2. Drainage of empyema  3. Decortication of the right lung    ----------------------------------------------------------------------------------------------------------------------  Brief Narrative:   Andrew Mueller is a 37 y.o. male with no significant medical history.  Patient presented with 3-week history of right-sided chest pain.  He was evaluated in the emergency department 1-1/2 weeks ago with a diagnosis of pleurisy and prescribed analgesics, steroids, azithromycin.  Patient stated that he took analgesics and steroids with associated worsening symptoms.  He has not taken azithromycin.  Last night, he reports having development of a cough with sputum production.  Sputum is described as slightly green-tinged.  He reports subjective fevers and chills CT scans for right lower and middle pneumonia with associated empyema; right hilar adenopathy which is likely reactive. Medications/Course: Ceftriaxone, azithromycin, albuterol, Atrovent Cardiothoracic surgery Dr.Brian Bartle was consulted.  Requested for patient to be transferred for possible VAST/thoracotomy treatment   Active Problems:   Right lower lobe pneumonia (HCC)   Empyema (HCC)   Assessment & Plan:   Right lower lobe pneumonia (CAP) with empyema -We will follow with cultures, continue antibiotics -PRN DuoNeb bronchodilator treatment  Empyema -06/13/2017, status post : Right thoracotomy, Drainage of empyema, Decortication of the right lung  -Cardiothoracic surgery Dr.Brian Bartle following   Tobacco abuse Tobacco cessation discussed Nicotine patch  DVT prophylaxis: Lovenox Code Status:  Full code Family Communication: None at bedside Disposition Plan:  Possibly home, >2 days   Consultants:   Cardio thoracic surgery Dr.Brian Bartle  Procedures:   06/13/2017, status post 1. Right thoracotomy  2. Drainage of empyema  3. Decortication of the right lung   Antimicrobials:  Anti-infectives (From admission, onward)   Start     Dose/Rate Route Frequency Ordered Stop   06/13/17 1030  cefTRIAXone (ROCEPHIN) 2 g in dextrose 5 % 50 mL IVPB  Status:  Discontinued     2 g 100 mL/hr over 30 Minutes Intravenous To Surgery 06/13/17 1026 06/13/17 1027   06/13/17 1030  cefTRIAXone (ROCEPHIN) 2 g in sodium chloride 0.9 % 100 mL IVPB     2 g 200 mL/hr over 30 Minutes Intravenous To Surgery 06/13/17 1029 06/14/17 1030   06/12/17 1200  cefTRIAXone (ROCEPHIN) 2 g in sodium chloride 0.9 % 100 mL IVPB  Status:  Discontinued     2 g 200 mL/hr over 30 Minutes Intravenous Every 24 hours 06/11/17 2000 06/12/17 1055   06/12/17 1200  azithromycin (ZITHROMAX) tablet 500 mg  Status:  Discontinued     500 mg Oral Every 24 hours 06/11/17 2000 06/12/17 1055   06/12/17 1200  [MAR Hold]  Ampicillin-Sulbactam (UNASYN) 3 g in sodium chloride 0.9 % 100 mL IVPB     (MAR Hold since Fri 06/13/2017 at 0905. Reason: Transfer to a Procedural area.)   3 g 200 mL/hr over 30 Minutes Intravenous Every 6 hours 06/12/17 1111     06/11/17 1800  piperacillin-tazobactam (ZOSYN) IVPB 3.375 g  Status:  Discontinued     3.375 g 12.5 mL/hr over 240 Minutes Intravenous Every 8 hours 06/11/17 1620 06/11/17 2000   06/11/17 1215  cefTRIAXone (ROCEPHIN) 1 g in sodium chloride 0.9 % 100 mL IVPB     1 g 200 mL/hr over 30 Minutes Intravenous  Once 06/11/17 1202 06/11/17 1321   06/11/17 1215  azithromycin (ZITHROMAX) 500 mg in sodium chloride 0.9 % 250 mL IVPB     500 mg 250 mL/hr over 60 Minutes Intravenous  Once 06/11/17 1202 06/11/17 1348      Objective: Vitals:   06/13/17 1224 06/13/17 1226 06/13/17 1239 06/13/17 1317    BP: (!) 146/86  (!) 149/84   Pulse: 95  90 88  Resp: (!) 32  (!) 26 (!) 27  Temp:  (!) 97.5 F (36.4 C)    TempSrc:      SpO2: 93%  94% 93%  Weight:      Height:        Intake/Output Summary (Last 24 hours) at 06/13/2017 1321 Last data filed at 06/13/2017 1226 Gross per 24 hour  Intake 3313.33 ml  Output 720 ml  Net 2593.33 ml   Filed Weights   06/11/17 0757 06/11/17 2100 06/12/17 2253  Weight: 117.9 kg (260 lb) 115.1 kg (253 lb 12 oz) 115.6 kg (254 lb 13.6 oz)    Data Reviewed: I have personally reviewed following labs and imaging studies  CBC: Recent Labs  Lab 06/11/17 1221 06/11/17 1229  WBC 13.2*  --   NEUTROABS 10.1*  --   HGB 13.1 13.9  HCT 38.4* 41.0  MCV 96.2  --   PLT 382  --    Basic Metabolic Panel: Recent Labs  Lab 06/11/17 1229  NA 133*  K 4.0  CL 94*  GLUCOSE 119*  BUN 12  CREATININE 0.80   GFR: Sepsis Labs: Recent Labs  Lab 06/11/17 1229 06/11/17 1915  PROCALCITON  --  0.63  LATICACIDVEN 1.25  --    Radiology Studies: Ct Chest W Contrast  Result Date: 06/11/2017 CLINICAL DATA:  Shortness of breath, right lateral chest pain for 1 month, and fever. EXAM: CT CHEST WITH CONTRAST TECHNIQUE: Multidetector CT imaging of the chest was performed during intravenous contrast administration. CONTRAST:  75mL ISOVUE-300 IOPAMIDOL (ISOVUE-300) INJECTION 61% COMPARISON:  05/31/2017. FINDINGS: Cardiovascular: Vascular structures are unremarkable. Heart size normal. No pericardial effusion. Mediastinum/Nodes: Mediastinal lymph nodes measure up to 1.6 cm in the low right paratracheal station, previously 9 mm. Subcarinal lymph node measures 1.7 cm, previously 12 mm. Right hilar adenopathy measures up to 1.6 cm, previously 11 mm. No axillary adenopathy. Esophagus is grossly unremarkable. Lungs/Pleura: Septal thickening, peribronchovascular nodularity/consolidation and volume loss in the right middle and right lower lobes are largely new from 05/31/2017. In  addition, there are sizable loculated collections of pleural fluid and air in the right hemithorax, also largely new from 05/31/2017. Probable subpleural lymph nodes along the minor fissure. Left lung is clear. Debris is seen dependently in the trachea. Upper Abdomen: Liver may be slightly decreased in attenuation diffusely. Visualized portions of the liver, gallbladder, adrenal glands, kidneys, spleen, pancreas stomach and bowel are otherwise grossly unremarkable. There are several upper abdominal lymph nodes which measure up to 10 mm in the gastrohepatic ligament. Musculoskeletal: Degenerative changes in the spine. IMPRESSION: 1. Septal thickening, peribronchovascular nodularity/consolidation and loculated collections of pleural fluid/air in the right hemithorax, largely new from 05/31/2017 and most indicative of bronchopneumonia with empyema. Difficult to definitively exclude a bronchopleural  fistula. 2. New mediastinal/subcarinal and right hilar adenopathy, likely reactive. 3. Liver may be steatotic. Electronically Signed   By: Leanna Battles M.D.   On: 06/11/2017 14:51   Dg Chest Port 1 View  Result Date: 06/13/2017 CLINICAL DATA:  Status post right thoracotomy. EXAM: PORTABLE CHEST 1 VIEW COMPARISON:  Chest radiographs and chest CT obtained yesterday. FINDINGS: Normal sized heart. Chest tubes overlying the right lower chest and upper abdomen with decreased right pleural fluid. There is some persistent patchy and ill-defined density in the right lower lung zone. Interval small amount of linear density in the left mid lung zone. Interval right jugular catheter with its tip overlying the upper right axilla. Decreased inspiration. Grossly normal sized heart. Unremarkable bones. IMPRESSION: 1. Malpositioned right jugular catheter with its tip in the right axillary vein. 2. Right basilar chest tubes with decreased pleural fluid and no pneumothorax. 3. Residual probable pneumonia at the right lung base. 4.  Interval linear atelectasis on the left. These results will be called to the ordering clinician or representative by the Radiologist Assistant, and communication documented in the PACS or zVision Dashboard. Electronically Signed   By: Beckie Salts M.D.   On: 06/13/2017 12:41    Scheduled Meds: . [MAR Hold] enoxaparin (LOVENOX) injection  40 mg Subcutaneous Q24H  . fentaNYL (SUBLIMAZE) injection  100 mcg Intravenous Once  . midazolam      . morphine   Intravenous Q4H  . [MAR Hold] nicotine  7 mg Transdermal Daily  . oxyCODONE       Continuous Infusions: . sodium chloride 75 mL/hr (06/12/17 2323)  . [MAR Hold] ampicillin-sulbactam (UNASYN) IV Stopped (06/13/17 0714)  . cefTRIAXone (ROCEPHIN) IVPB 2 gram/100 mL NS (Mini-Bag Plus)    . lactated ringers 50 mL/hr (06/13/17 0913)    Time spent: >25 minutes  Kendell Bane, MD [facility],  Pager [phone]  If 7PM-7AM, please contact night-coverage [email or web address]   Password [email or web address]  06/13/2017, 1:21 PM

## 2017-06-13 NOTE — Consult Note (Signed)
Cardiothoracic Surgery  Full consult note to follow. I will plan to do surgery to drain his empyema later this morning. Keep NPO. I will see him this morning.

## 2017-06-13 NOTE — Op Note (Signed)
06/13/2017 Lemar LivingsKevin L Molden [id number]  Surgeon: Alleen BorneBryan K. Torrie Lafavor, MD   First Assistant: Lowella DandyErin Barrett, PA-C  Preoperative Diagnosis: Right empyema   Postoperative Diagnosis: Right empyema   Procedure:  1. Right thoracotomy  2. Drainage of empyema  3. Decortication of the right lung   Anesthesia: General Endotracheal   Clinical History/Surgical Indication:   The patient is a previously healthy 37 year old smoker who reports developing right-sided chest pain and some shortness of breath about 1 month ago.  He went to the emergency room on 518 with complaints of cough, low-grade fever, and pleuritic right chest pain.  Chest CT scan was negative for pulmonary embolism.  There is small right pleural effusion with subsegmental consolidation or atelectasis in the right middle and lower lobes.  He said he was started on prednisone and an anti-inflammatory pain medication as well as incentive spirometry and instructions to stop smoking.  He was not treated with antibiotics.  He said that he continued to have worsening right chest pain and cough as well as thick yellow sputum.  He has had low-grade fevers.  [facility] and [facility] chest CT scan on 529 showed multiloculated right pleural fluid collections with some air present within the fluid suggesting pneumonia and empyema.  There is some mediastinal and subcarinal as well as right hilar adenopathy is likely reactive.  [facility] this [facility].  This 37 year old smoker has a right lung pneumonia with empyema.  This is multiloculated and will require a small thoracotomy for complete drainage and decortication of the lung.  I discussed the operative procedure with the patient including alternatives, benefits, and risks including but not limited to bleeding, infection, prolonged air leak, and recurrent pleural effusion.  He understands and agrees to proceed.  Preparation:  The patient  was seen in the preoperative holding area and the correct patient, correct operation, correct operative sidewere confirmed with the patient after reviewing the medical record and CT scan. The consent was signed by me. Preoperative antibiotics were given. The right side of the chest was signed by me. The patient was taken back to the operating room and positioned supine on the operating room table. After being placed under general endotracheal anesthesia by the anesthesia team using a double lumen tube a foley catheter was placed. The patient was turned into the left lateral decubitus position. The chest was prepped with betadine soap and solution. A surgical time-out was taken and the correct patient,operative side, and operative procedure were confirmed with the nursing and anesthesia staff.   Operative Procedure:  A short lateral thoracotomy incision was made and the chest entered through the 7th ICS. The pleural space was filled with creamy white pus with a thick fibrinous peel over the lung. The empyema was completely drained and the right lower lobe of the lung decorticated. The middle and upper lobes were firmly adhered to the parietal pleura and could not be mobilized. This allowed complete expansion of the lung. The chest was irrigated with warm saline. Hemostasis was complete. One 4232 F Bard drain and one 5936 F right angle tube were placed through separate stab incisions and were positioned in the pleural space. The ribs were reapproximated with # 2 vicryl pericostal sutures and the muscles closed with continuous 0 vicryl suture. The subcutaneous tissue was closed with 2-0 vicryl continuous suture. The skin was closed with staples.  All sponge, needle, and instrument counts were reported correct  at the end of the case. Dry sterile dressings were placed over the incisions and around the chest tubes which were connected to pleurevac suction. The patient was turned supine, extubated,then transported to the  PACU in satisfactory and stable condition.

## 2017-06-13 NOTE — Transfer of Care (Signed)
Immediate Anesthesia Transfer of Care Note  Patient: Andrew Mueller  Procedure(s) Performed: VIDEO ASSISTED THORACOSCOPY (VATS)/DRAIN EMPYEMA (Right Chest) THORACOTOMY MAJOR (Right Chest)  Patient Location: PACU  Anesthesia Type:General  Level of Consciousness: awake, oriented, patient cooperative and responds to stimulation  Airway & Oxygen Therapy: Patient Spontanous Breathing and Patient connected to nasal cannula oxygen  Post-op Assessment: Report given to RN, Post -op Vital signs reviewed and stable and Patient moving all extremities X 4  Post vital signs: Reviewed and stable  Last Vitals:  Vitals Value Taken Time  BP 146/86 06/13/2017 12:24 PM  Temp    Pulse 95 06/13/2017 12:25 PM  Resp 27 06/13/2017 12:25 PM  SpO2 93 % 06/13/2017 12:25 PM  Vitals shown include unvalidated device data.  Last Pain:  Vitals:   06/13/17 0812  TempSrc: Oral  PainSc: 3       Patients Stated Pain Goal: 0 (06/12/17 2253)  Complications: No apparent anesthesia complications

## 2017-06-13 NOTE — Progress Notes (Signed)
Wasted 31ml/4mg  remaining in syringe, witnessed by Waynetta Sandy, RN.

## 2017-06-13 NOTE — Progress Notes (Signed)
[facility] BRIEF SICU PROGRESS NOTE  Day of Surgery  S/P Procedure(s) (LRB): VIDEO ASSISTED THORACOSCOPY (VATS)/DRAIN EMPYEMA (Right) THORACOTOMY MAJOR (Right)   Awake and alert, expected pain in chest w/ adequate analgesia Breathing comfortably NSR w/ stable BP Chest tube output low  Plan: Continue routine early postop  Purcell Nails, MD 06/13/2017 5:53 PM

## 2017-06-13 NOTE — Anesthesia Procedure Notes (Signed)
Central Venous Catheter Insertion Performed by: Arta Bruce, MD, anesthesiologist Start/End5/31/2019 9:30 AM, 06/13/2017 9:40 AM Patient location: Pre-op. Preanesthetic checklist: patient identified, IV checked, risks and benefits discussed, surgical consent, monitors and equipment checked, pre-op evaluation, timeout performed and anesthesia consent Position: Trendelenburg Lidocaine 1% used for infiltration and patient sedated Hand hygiene performed , maximum sterile barriers used  and Seldinger technique used Catheter size: 8 Fr Total catheter length 16. Central line was placed.Double lumen Procedure performed using ultrasound guided technique. Ultrasound Notes:anatomy identified, needle tip was noted to be adjacent to the nerve/plexus identified, no ultrasound evidence of intravascular and/or intraneural injection and image(s) printed for medical record Attempts: 1 Following insertion, dressing applied, line sutured and Biopatch. Post procedure assessment: blood return through all ports  Patient tolerated the procedure well with no immediate complications.

## 2017-06-13 NOTE — Anesthesia Preprocedure Evaluation (Signed)
Anesthesia Evaluation  Patient identified by MRN, date of birth, ID band Patient awake    Reviewed: Allergy & Precautions, NPO status , Patient's Chart, lab work & pertinent test results  Airway Mallampati: II  TM Distance: >3 FB Neck ROM: Full    Dental   Pulmonary Current Smoker,    Pulmonary exam normal        Cardiovascular Normal cardiovascular exam     Neuro/Psych    GI/Hepatic   Endo/Other    Renal/GU      Musculoskeletal   Abdominal   Peds  Hematology   Anesthesia Other Findings   Reproductive/Obstetrics                             Anesthesia Physical Anesthesia Plan  ASA: III  Anesthesia Plan: General   Post-op Pain Management:    Induction: Intravenous  PONV Risk Score and Plan: 1 and Ondansetron and Treatment may vary due to age or medical condition  Airway Management Planned: Double Lumen EBT  Additional Equipment: Arterial line, CVP and Ultrasound Guidance Line Placement  Intra-op Plan:   Post-operative Plan: Extubation in OR  Informed Consent: I have reviewed the patients History and Physical, chart, labs and discussed the procedure including the risks, benefits and alternatives for the proposed anesthesia with the patient or authorized representative who has indicated his/her understanding and acceptance.     Plan Discussed with: CRNA and Surgeon  Anesthesia Plan Comments:         Anesthesia Quick Evaluation

## 2017-06-13 NOTE — Brief Op Note (Signed)
06/13/2017  12:12 PM  PATIENT:  Lemar Livings  37 y.o. male  PRE-OPERATIVE DIAGNOSIS:  RIGHT LOCULATED EFFUSION  POST-OPERATIVE DIAGNOSIS:  RIGHT empyema PROCEDURE:  Procedure(s): VIDEO ASSISTED THORACOSCOPY (VATS)/DRAIN EMPYEMA (Right) THORACOTOMY MAJOR (Right)  Drainage of empyema and decortication of right lung  SURGEON:  Surgeon(s) and Role:    * Bartle, Payton Doughty, MD - Primary  PHYSICIAN ASSISTANT: Erin Barrett, PA-C    ANESTHESIA:   general  EBL:  50 mL   BLOOD ADMINISTERED:none  DRAINS: one 66 F right angle chest tube in posterior costophrenic sulcus and one 32 F Blake drain    LOCAL MEDICATIONS USED:  NONE  SPECIMEN:  Source of Specimen:  right pleural pus and pleural peel for culture  DISPOSITION OF SPECIMEN:  micro  COUNTS:  YES  TOURNIQUET:  * No tourniquets in log *  DICTATION: .Note written in EPIC  PLAN OF CARE: Admit to inpatient   PATIENT DISPOSITION:  PACU - hemodynamically stable.   Delay start of Pharmacological VTE agent (>24hrs) due to surgical blood loss or risk of bleeding: yes

## 2017-06-13 NOTE — Consult Note (Signed)
[address]       [place] [address]             [phone]      Cardiothoracic Surgery Consultation  Reason for Consult: Right empyema Referring Physician: Zenia Resides, NP  Andrew Mueller is an 37 y.o. male.  HPI:   The patient is a previously healthy 37 year old smoker who reports developing right-sided chest pain and some shortness of breath about 1 month ago.  He went to the emergency room on 518 with complaints of cough, low-grade fever, and pleuritic right chest pain.  Chest CT scan was negative for pulmonary embolism.  There is small right pleural effusion with subsegmental consolidation or atelectasis in the right middle and lower lobes.  He said he was started on prednisone and an anti-inflammatory pain medication as well as incentive spirometry and instructions to stop smoking.  He was not treated with antibiotics.  He said that he continued to have worsening right chest pain and cough as well as thick yellow sputum.  He has had low-grade fevers.  He returned to the emergency room at [facility] and a chest CT scan on 529 showed multiloculated right pleural fluid collections with some air present within the fluid suggesting pneumonia and empyema.  There is some mediastinal and subcarinal as well as right hilar adenopathy is likely reactive.  He was transferred to [facility] overnight for surgery this morning.  History reviewed. No pertinent past medical history.  Past Surgical History:  Procedure Laterality Date  . HAND SURGERY      Family History  Problem Relation Age of Onset  . Diabetes Mother   . Diabetes Father     Social History:  reports that he has been smoking cigarettes.  He has a 20.00 pack-year smoking history. He has never used smokeless tobacco. He reports that he does not drink alcohol or use drugs.  Allergies: No Known Allergies  Medications:  I have reviewed the patient's current medications. Prior to Admission:  Medications  Prior to Admission  Medication Sig Dispense Refill Last Dose  . meloxicam (MOBIC) 15 MG tablet Take 1 tablet (15 mg total) by mouth daily. 30 tablet 0 06/11/2017 at Unknown time  . oxyCODONE-acetaminophen (PERCOCET) 5-325 MG tablet Take 1-2 tablets by mouth every 4 (four) hours as needed. 20 tablet 0 06/11/2017 at Unknown time  . predniSONE (STERAPRED UNI-PAK 21 TAB) 10 MG (21) TBPK tablet Use as directed 21 tablet 0 06/11/2017 at Unknown time  . azithromycin (ZITHROMAX Z-PAK) 250 MG tablet 2 po day one, then 1 daily x 4 days (Patient not taking: Reported on 06/11/2017) 5 tablet 0 Completed Course at Unknown time  . guaiFENesin-codeine 100-10 MG/5ML syrup Take 5-10 mLs by mouth every 6 (six) hours as needed for cough. (Patient not taking: Reported on 06/11/2017) 120 mL 0 Completed Course at Unknown time  . HYDROcodone-acetaminophen (NORCO/VICODIN) 5-325 MG per tablet Take 1 tablet by mouth every 6 (six) hours as needed. (Patient not taking: Reported on 06/11/2017) 20 tablet 0 Completed Course at Unknown time  . naproxen (NAPROSYN) 500 MG tablet Take 1 tablet (500 mg total) by mouth 2 (two) times daily. (Patient not taking: Reported on 06/11/2017) 30 tablet 0 Completed Course at Unknown time   Scheduled: . enoxaparin (LOVENOX) injection  40 mg Subcutaneous Q24H  . nicotine  7 mg Transdermal Daily   Continuous: . sodium chloride 75 mL/hr (06/12/17 2323)  . ampicillin-sulbactam (UNASYN) IV 3  g (06/13/17 1610)   RUE:[id number], HYDROcodone-acetaminophen Anti-infectives (From admission, onward)   Start     Dose/Rate Route Frequency Ordered Stop   06/12/17 1200  cefTRIAXone (ROCEPHIN) 2 g in sodium chloride 0.9 % 100 mL IVPB  Status:  Discontinued     2 g 200 mL/hr over 30 Minutes Intravenous Every 24 hours 06/11/17 2000 06/12/17 1055   06/12/17 1200  azithromycin (ZITHROMAX) tablet 500 mg  Status:  Discontinued     500 mg Oral Every 24 hours 06/11/17 2000 06/12/17 1055   06/12/17 1200   Ampicillin-Sulbactam (UNASYN) 3 g in sodium chloride 0.9 % 100 mL IVPB     3 g 200 mL/hr over 30 Minutes Intravenous Every 6 hours 06/12/17 1111     06/11/17 1800  piperacillin-tazobactam (ZOSYN) IVPB 3.375 g  Status:  Discontinued     3.375 g 12.5 mL/hr over 240 Minutes Intravenous Every 8 hours 06/11/17 1620 06/11/17 2000   06/11/17 1215  cefTRIAXone (ROCEPHIN) 1 g in sodium chloride 0.9 % 100 mL IVPB     1 g 200 mL/hr over 30 Minutes Intravenous  Once 06/11/17 1202 06/11/17 1321   06/11/17 1215  azithromycin (ZITHROMAX) 500 mg in sodium chloride 0.9 % 250 mL IVPB     500 mg 250 mL/hr over 60 Minutes Intravenous  Once 06/11/17 1202 06/11/17 1348      Results for orders placed or performed during the hospital encounter of 06/11/17 (from the past 48 hour(s))  CBC with Differential/Platelet     Status: Abnormal   Collection Time: 06/11/17 12:21 PM  Result Value Ref Range   WBC 13.2 (H) 4.0 - 10.5 K/uL   RBC 3.99 (L) 4.22 - 5.81 MIL/uL   Hemoglobin 13.1 13.0 - 17.0 g/dL   HCT 14.7 (L) 82.9 - 56.2 %   MCV 96.2 78.0 - 100.0 fL   MCH 32.8 26.0 - 34.0 pg   MCHC 34.1 30.0 - 36.0 g/dL   RDW 13.0 86.5 - 78.4 %   Platelets 382 150 - 400 K/uL   Neutrophils Relative % 76 %   Neutro Abs 10.1 (H) 1.7 - 7.7 K/uL   Lymphocytes Relative 14 %   Lymphs Abs 1.8 0.7 - 4.0 K/uL   Monocytes Relative 9 %   Monocytes Absolute 1.2 (H) 0.1 - 1.0 K/uL   Eosinophils Relative 1 %   Eosinophils Absolute 0.1 0.0 - 0.7 K/uL   Basophils Relative 0 %   Basophils Absolute 0.0 0.0 - 0.1 K/uL    Comment: Performed at [facility], [address], [place], [place] [address]  I-stat chem 8, ed     Status: Abnormal   Collection Time: 06/11/17 12:29 PM  Result Value Ref Range   Sodium 133 (L) 135 - 145 mmol/L   Potassium 4.0 3.5 - 5.1 mmol/L   Chloride 94 (L) 101 - 111 mmol/L   BUN 12 6 - 20 mg/dL   Creatinine, Ser 5.28 0.61 - 1.24 mg/dL   Glucose, Bld 413 (H) 65 - 99 mg/dL   Calcium, Ion  2.44 (L) 1.15 - 1.40 mmol/L   TCO2 28 22 - 32 mmol/L   Hemoglobin 13.9 13.0 - 17.0 g/dL   HCT 01.0 27.2 - 53.6 %  I-Stat CG4 Lactic Acid, ED     Status: None   Collection Time: 06/11/17 12:29 PM  Result Value Ref Range   Lactic Acid, Venous 1.25 0.5 - 1.9 mmol/L  Culture, blood (routine x 2)  Status: None (Preliminary result)   Collection Time: 06/11/17  7:15 PM  Result Value Ref Range   Specimen Description      BLOOD LEFT FOREARM Performed at [facility], [address], [place], [place] [address]    Special Requests      BOTTLES DRAWN AEROBIC ONLY Blood Culture results may not be optimal due to an inadequate volume of blood received in culture bottles Performed at [facility], [address] [place], [place] [address]    Culture      NO GROWTH < 24 HOURS Performed at [facility], [address] [place], [place] [address]    Report Status PENDING   HIV antibody     Status: None   Collection Time: 06/11/17  7:15 PM  Result Value Ref Range   HIV Screen 4th Generation wRfx Non Reactive Non Reactive    Comment: (NOTE) Performed At: [facility] [address] [place], [place] [id number] Jolene Schimke MD ZD:[id number] Performed at [facility], [address], [place], [place] [address]   Procalcitonin - Baseline     Status: None   Collection Time: 06/11/17  7:15 PM  Result Value Ref Range   Procalcitonin 0.63 ng/mL    Comment:        Interpretation: PCT > 0.5 ng/mL and <= 2 ng/mL: Systemic infection (sepsis) is possible, but other conditions are known to elevate PCT as well. (NOTE)       Sepsis PCT Algorithm           Lower Respiratory Tract                                      Infection PCT Algorithm    ----------------------------     ----------------------------         PCT < 0.25 ng/mL                PCT < 0.10 ng/mL         Strongly encourage             Strongly discourage    discontinuation of antibiotics    initiation of antibiotics    ----------------------------     -----------------------------       PCT 0.25 - 0.50 ng/mL            PCT 0.10 - 0.25 ng/mL               OR       >80% decrease in PCT            Discourage initiation of                                            antibiotics      Encourage discontinuation           of antibiotics    ----------------------------     -----------------------------         PCT >= 0.50 ng/mL              PCT 0.26 - 0.50 ng/mL                AND       <80% decrease in PCT  Encourage initiation of                                             antibiotics       Encourage continuation           of antibiotics    ----------------------------     -----------------------------        PCT >= 0.50 ng/mL                  PCT > 0.50 ng/mL               AND         increase in PCT                  Strongly encourage                                      initiation of antibiotics    Strongly encourage escalation           of antibiotics                                     -----------------------------                                           PCT <= 0.25 ng/mL                                                 OR                                        > 80% decrease in PCT                                     Discontinue / Do not initiate                                             antibiotics Performed at [facility], [address] [place], [place] [address]   Culture, blood (routine x 2)     Status: None (Preliminary result)   Collection Time: 06/11/17  7:16 PM  Result Value Ref Range   Specimen Description      BLOOD RIGHT FOREARM Performed at [facility], [address] [place], [place] [address]    Special Requests      BOTTLES DRAWN AEROBIC AND ANAEROBIC Blood Culture adequate volume Performed at [facility], [address], [place],  [place] [address]    Culture      NO GROWTH < 24 HOURS Performed at [facility], [address] [place], [place] [address]  Report Status PENDING   MRSA PCR Screening     Status: None   Collection Time: 06/12/17  4:34 AM  Result Value Ref Range   MRSA by PCR NEGATIVE NEGATIVE    Comment:        The GeneXpert MRSA Assay (FDA approved for NASAL specimens only), is one component of a comprehensive MRSA colonization surveillance program. It is not intended to diagnose MRSA infection nor to guide or monitor treatment for MRSA infections. Performed at [facility], [address], [place], [place] [address]   Culture, expectorated sputum-assessment     Status: None   Collection Time: 06/12/17  9:45 AM  Result Value Ref Range   Specimen Description SPUTUM    Special Requests NONE    Sputum evaluation      THIS SPECIMEN IS ACCEPTABLE FOR SPUTUM CULTURE Performed at [facility], [address], [place], [place] [address]    Report Status 06/12/2017 FINAL   Culture, respiratory (NON-Expectorated)     Status: None (Preliminary result)   Collection Time: 06/12/17  9:45 AM  Result Value Ref Range   Specimen Description      SPUTUM Performed at [facility], [address] [place], [place] [address]    Special Requests      NONE Reflexed from [phone] Performed at [facility], [address] [place], [place] [address]    Gram Stain      ABUNDANT WBC PRESENT, PREDOMINANTLY PMN FEW GRAM POSITIVE COCCI FEW GRAM VARIABLE ROD RARE YEAST Performed at [facility], [address], [place], [place] [address]    Culture PENDING    Report Status PENDING   Strep pneumoniae urinary antigen     Status: None   Collection Time: 06/12/17 11:56 AM  Result Value Ref Range   Strep Pneumo Urinary Antigen NEGATIVE NEGATIVE    Comment:        Infection due to S. pneumoniae cannot be absolutely ruled out since  the antigen present may be below the detection limit of the test. Performed at [facility], [address] [place], [place] [address]     Dg Chest 2 View  Result Date: 06/11/2017 CLINICAL DATA:  Right lower chest pain, shortness of breath, cough EXAM: CHEST - 2 VIEW COMPARISON:  05/31/2017 FINDINGS: There is airspace consolidation noted in the inferior right upper lobe and right lower lobe, worsening since prior study. Air-fluid level is noted peripherally in the right lower hemithorax which could reflect abscess or bronchopleural fistula. Left lung clear. Heart is normal size. No acute bony abnormality. IMPRESSION: Worsening airspace consolidation in the right lower lobe and inferior right upper lobe concerning for pneumonia. Short air-fluid level noted peripherally at the right lung base could be parenchymal or pleural and represent abscess or bronchopleural fistula. Recommend further evaluation with chest CT with IV contrast. Electronically Signed   By: Charlett Nose M.D.   On: 06/11/2017 09:09   Ct Chest W Contrast  Result Date: 06/11/2017 CLINICAL DATA:  Shortness of breath, right lateral chest pain for 1 month, and fever. EXAM: CT CHEST WITH CONTRAST TECHNIQUE: Multidetector CT imaging of the chest was performed during intravenous contrast administration. CONTRAST:  75mL ISOVUE-300 IOPAMIDOL (ISOVUE-300) INJECTION 61% COMPARISON:  05/31/2017. FINDINGS: Cardiovascular: Vascular structures are unremarkable. Heart size normal. No pericardial effusion. Mediastinum/Nodes: Mediastinal lymph nodes measure up to 1.6 cm in the low right paratracheal station, previously 9 mm. Subcarinal lymph node measures  1.7 cm, previously 12 mm. Right hilar adenopathy measures up to 1.6 cm, previously 11 mm. No axillary adenopathy. Esophagus is grossly unremarkable. Lungs/Pleura: Septal thickening, peribronchovascular nodularity/consolidation and volume loss in the right middle and right lower lobes are largely  new from 05/31/2017. In addition, there are sizable loculated collections of pleural fluid and air in the right hemithorax, also largely new from 05/31/2017. Probable subpleural lymph nodes along the minor fissure. Left lung is clear. Debris is seen dependently in the trachea. Upper Abdomen: Liver may be slightly decreased in attenuation diffusely. Visualized portions of the liver, gallbladder, adrenal glands, kidneys, spleen, pancreas stomach and bowel are otherwise grossly unremarkable. There are several upper abdominal lymph nodes which measure up to 10 mm in the gastrohepatic ligament. Musculoskeletal: Degenerative changes in the spine. IMPRESSION: 1. Septal thickening, peribronchovascular nodularity/consolidation and loculated collections of pleural fluid/air in the right hemithorax, largely new from 05/31/2017 and most indicative of bronchopneumonia with empyema. Difficult to definitively exclude a bronchopleural fistula. 2. New mediastinal/subcarinal and right hilar adenopathy, likely reactive. 3. Liver may be steatotic. Electronically Signed   By: Leanna Battles M.D.   On: 06/11/2017 14:51    Review of Systems  Constitutional: Positive for fever, malaise/fatigue and weight loss.  HENT: Negative.   Eyes: Negative.   Respiratory: Positive for cough, sputum production and shortness of breath. Negative for hemoptysis.        Yellow sputum  Cardiovascular: Positive for chest pain.       Pleuritic right side  Gastrointestinal: Negative.   Genitourinary: Negative.   Musculoskeletal: Negative.   Skin: Negative.   Neurological: Negative.   Endo/Heme/Allergies: Negative.   Psychiatric/Behavioral: Negative.    Blood pressure (!) 147/82, pulse (!) 111, temperature (!) 100.6 F (38.1 C), temperature source Oral, resp. rate 18, height 6\' 3"  (1.905 m), weight 115.6 kg (254 lb 13.6 oz), SpO2 94 %. Physical Exam  Constitutional: He is oriented to person, place, and time. He appears well-developed and  well-nourished.  Moderately obese Coughing and looks uncomfortable  HENT:  Head: Normocephalic and atraumatic.  Mouth/Throat: Oropharynx is clear and moist.  Poor dentition  Eyes: Pupils are equal, round, and reactive to light. EOM are normal.  Neck: Normal range of motion. Neck supple. No JVD present. No thyromegaly present.  Cardiovascular: Normal rate, regular rhythm, normal heart sounds and intact distal pulses.  No murmur heard. Respiratory: He has wheezes.  Decreased breath sounds on the right with rhonchi.  GI: Soft. Bowel sounds are normal. He exhibits no distension. There is no tenderness.  Musculoskeletal: He exhibits no edema or tenderness.  Lymphadenopathy:    He has no cervical adenopathy.  Neurological: He is alert and oriented to person, place, and time. He has normal strength. No cranial nerve deficit or sensory deficit.  Skin: Skin is warm and dry.  Psychiatric: He has a normal mood and affect.   CLINICAL DATA:  Shortness of breath, right lateral chest pain for 1 month, and fever.  EXAM: CT CHEST WITH CONTRAST  TECHNIQUE: Multidetector CT imaging of the chest was performed during intravenous contrast administration.  CONTRAST:  75mL ISOVUE-300 IOPAMIDOL (ISOVUE-300) INJECTION 61%  COMPARISON:  05/31/2017.  FINDINGS: Cardiovascular: Vascular structures are unremarkable. Heart size normal. No pericardial effusion.  Mediastinum/Nodes: Mediastinal lymph nodes measure up to 1.6 cm in the low right paratracheal station, previously 9 mm. Subcarinal lymph node measures 1.7 cm, previously 12 mm. Right hilar adenopathy measures up to 1.6 cm, previously 11 mm. No axillary adenopathy. Esophagus  is grossly unremarkable.  Lungs/Pleura: Septal thickening, peribronchovascular nodularity/consolidation and volume loss in the right middle and right lower lobes are largely new from 05/31/2017. In addition, there are sizable loculated collections of pleural fluid  and air in the right hemithorax, also largely new from 05/31/2017. Probable subpleural lymph nodes along the minor fissure. Left lung is clear. Debris is seen dependently in the trachea.  Upper Abdomen: Liver may be slightly decreased in attenuation diffusely. Visualized portions of the liver, gallbladder, adrenal glands, kidneys, spleen, pancreas stomach and bowel are otherwise grossly unremarkable. There are several upper abdominal lymph nodes which measure up to 10 mm in the gastrohepatic ligament.  Musculoskeletal: Degenerative changes in the spine.  IMPRESSION: 1. Septal thickening, peribronchovascular nodularity/consolidation and loculated collections of pleural fluid/air in the right hemithorax, largely new from 05/31/2017 and most indicative of bronchopneumonia with empyema. Difficult to definitively exclude a bronchopleural fistula. 2. New mediastinal/subcarinal and right hilar adenopathy, likely reactive. 3. Liver may be steatotic.   Electronically Signed   By: Leanna Battles M.D.   On: 06/11/2017 14:51  Assessment/Plan:  This 37 year old smoker has a right lung pneumonia with empyema.  This is multiloculated and will require a small thoracotomy for complete drainage and decortication of the lung.  I discussed the operative procedure with the patient including alternatives, benefits, and risks including but not limited to bleeding, infection, prolonged air leak, and recurrent pleural effusion.  He understands and agrees to proceed.  We will plan to do this later this morning.  I spent 45 minutes performing this consultation and > 50% of this time was spent face to face counseling and coordinating the care of this patient's right empyema.  Payton Doughty Nicholl Onstott 06/13/2017, 7:08 AM

## 2017-06-13 NOTE — Anesthesia Procedure Notes (Signed)
Procedure Name: Intubation Date/Time: 06/13/2017 10:28 AM Performed by: Glynda Jaeger, CRNA Pre-anesthesia Checklist: Patient identified, Patient being monitored, Timeout performed, Emergency Drugs available and Suction available Patient Re-evaluated:Patient Re-evaluated prior to induction Oxygen Delivery Method: Circle System Utilized Preoxygenation: Pre-oxygenation with 100% oxygen Induction Type: IV induction Ventilation: Mask ventilation without difficulty Laryngoscope Size: Mac and 4 Grade View: Grade I Tube type: Oral Endobronchial tube: Double lumen EBT and Left and 41 Fr Number of attempts: 1 Airway Equipment and Method: Stylet Placement Confirmation: ETT inserted through vocal cords under direct vision,  positive ETCO2 and breath sounds checked- equal and bilateral Secured at: 39 cm Tube secured with: Tape Dental Injury: Teeth and Oropharynx as per pre-operative assessment

## 2017-06-13 NOTE — Progress Notes (Signed)
D/c centarl line/IJ and may give up to Dialudid more per dr Michelle Piper

## 2017-06-14 ENCOUNTER — Encounter (HOSPITAL_COMMUNITY): Payer: Self-pay | Admitting: Surgery

## 2017-06-14 ENCOUNTER — Inpatient Hospital Stay (HOSPITAL_COMMUNITY): Payer: Self-pay

## 2017-06-14 DIAGNOSIS — Z9889 Other specified postprocedural states: Secondary | ICD-10-CM

## 2017-06-14 LAB — GLUCOSE, CAPILLARY
GLUCOSE-CAPILLARY: 142 mg/dL — AB (ref 65–99)
GLUCOSE-CAPILLARY: 146 mg/dL — AB (ref 65–99)
GLUCOSE-CAPILLARY: 185 mg/dL — AB (ref 65–99)
Glucose-Capillary: 184 mg/dL — ABNORMAL HIGH (ref 65–99)

## 2017-06-14 LAB — CBC
HEMATOCRIT: 31.6 % — AB (ref 39.0–52.0)
HEMOGLOBIN: 10.4 g/dL — AB (ref 13.0–17.0)
MCH: 31.8 pg (ref 26.0–34.0)
MCHC: 32.9 g/dL (ref 30.0–36.0)
MCV: 96.6 fL (ref 78.0–100.0)
Platelets: 397 10*3/uL (ref 150–400)
RBC: 3.27 MIL/uL — AB (ref 4.22–5.81)
RDW: 11.9 % (ref 11.5–15.5)
WBC: 9.2 10*3/uL (ref 4.0–10.5)

## 2017-06-14 LAB — ACID FAST SMEAR (AFB, MYCOBACTERIA): Acid Fast Smear: NEGATIVE

## 2017-06-14 LAB — BASIC METABOLIC PANEL
Anion gap: 8 (ref 5–15)
BUN: 8 mg/dL (ref 6–20)
CHLORIDE: 95 mmol/L — AB (ref 101–111)
CO2: 26 mmol/L (ref 22–32)
Calcium: 7.5 mg/dL — ABNORMAL LOW (ref 8.9–10.3)
Creatinine, Ser: 0.71 mg/dL (ref 0.61–1.24)
GFR calc non Af Amer: >60 mL/min (ref >60)
Glucose, Bld: 163 mg/dL — ABNORMAL HIGH (ref 65–99)
POTASSIUM: 3.9 mmol/L (ref 3.5–5.1)
SODIUM: 129 mmol/L — AB (ref 135–145)

## 2017-06-14 LAB — POCT I-STAT 3, ART BLOOD GAS (G3+)
Acid-Base Excess: 2 mmol/L (ref 0.0–2.0)
BICARBONATE: 26.7 mmol/L (ref 20.0–28.0)
O2 Saturation: 97 %
PCO2 ART: 39.8 mmHg (ref 32.0–48.0)
TCO2: 28 mmol/L (ref 22–32)
pH, Arterial: 7.434 (ref 7.350–7.450)
pO2, Arterial: 92 mmHg (ref 83.0–108.0)

## 2017-06-14 NOTE — Plan of Care (Signed)

## 2017-06-14 NOTE — Progress Notes (Addendum)
      [address]       [place] [id number]             [phone]        CARDIOTHORACIC SURGERY PROGRESS NOTE   R1 Day Post-Op Procedure(s) (LRB): VIDEO ASSISTED THORACOSCOPY (VATS)/DRAIN EMPYEMA (Right) THORACOTOMY MAJOR (Right)  Subjective: Looks good.  Mild soreness in chest.  Good cough and resp effort, coughing up secretions.  Hungry  Objective: Vital signs: BP Readings from Last 1 Encounters:  06/14/17 (!) 114/103   Pulse Readings from Last 1 Encounters:  06/14/17 72   Resp Readings from Last 1 Encounters:  06/14/17 17   Temp Readings from Last 1 Encounters:  06/14/17 97.6 F (36.4 C) (Oral)    Hemodynamics:    Physical Exam:  Rhythm:   sinus  Breath sounds: Scattered rhonchi  Heart sounds:  RRR  Incisions:  Dressings dry, intact  Abdomen:  Soft, non-distended, non-tender  Extremities:  Warm, well-perfused  Chest tubes:  low volume thin serosanguinous output, no air leak    Intake/Output from previous day: 05/31 0701 - 06/01 0700 In: 4420.8 [P.O.:600; I.V.:2720.8; IV Piggyback:1100] Out: 2690 [Urine:2140; Blood:50; Chest Tube:500] Intake/Output this shift: No intake/output data recorded.  Lab Results:  CBC: Recent Labs    06/11/17 1221 06/11/17 1229 06/14/17 0234  WBC 13.2*  --  9.2  HGB 13.1 13.9 10.4*  HCT 38.4* 41.0 31.6*  PLT 382  --  397    BMET:  Recent Labs    06/11/17 1229 06/14/17 0234  NA 133* 129*  K 4.0 3.9  CL 94* 95*  CO2  --  26  GLUCOSE 119* 163*  BUN 12 8  CREATININE 0.80 0.71  CALCIUM  --  7.5*     PT/INR:  No results for input(s): LABPROT, INR in the last 72 hours.  CBG (last 3)  Recent Labs    06/14/17 0021 06/14/17 0433 06/14/17 0805  GLUCAP 185* 146* 184*    ABG    Component Value Date/Time   PHART 7.434 06/14/2017 0431   PCO2ART 39.8 06/14/2017 0431   PO2ART 92.0 06/14/2017 0431   HCO3 26.7 06/14/2017 0431   TCO2 28 06/14/2017 0431   O2SAT 97.0 06/14/2017 0431     CXR: PORTABLE CHEST 1 VIEW  COMPARISON:  06/13/2017 and older exams.  FINDINGS: Stable right lower hemithorax chest tubes. There is stable persistent right lung base opacity. Right upper lung is clear. No pneumothorax.  Linear atelectasis extends laterally from the left hilum, stable. Left lung otherwise clear.  The previously described malpositioned right internal jugular central venous line has been removed.  Cardiac silhouette is normal size.  IMPRESSION: 1. No change in right inferior hemithorax chest tubes or right lung base opacity when compared the prior exam. 2. No new lung abnormalities. 3. No pneumothorax.   Electronically Signed   By: Amie Portlandavid  Ormond M.D.   On: 06/14/2017 08:10  Assessment/Plan: S/P Procedure(s) (LRB): VIDEO ASSISTED THORACOSCOPY (VATS)/DRAIN EMPYEMA (Right) THORACOTOMY MAJOR (Right)  Doing well POD1 Mobilize Pulm toilet Continue ABx D/C Aline D/C Foley Advance diet D/C IV fluids Transfer stepdown   Purcell Nailslarence H Owen, MD 06/14/2017 9:08 AM

## 2017-06-14 NOTE — Progress Notes (Signed)
PROGRESS NOTE    Andrew Mueller  ZOX:[id number] DOB: 02/04/1980 DOA: 06/11/2017 PCP: Patient, No Pcp Per   Brief Narrative:  37 y.o. WM PMHx Tobacco abuse  Presented with 3-week history of right-sided chest pain.  He was evaluated in the emergency department 1-1/2 weeks ago with a diagnosis of pleurisy and prescribed analgesics, steroids, azithromycin.  Patient states that he took analgesics and steroids with associated worsening symptoms.  He has not taken azithromycin.  Last night, he reports having development of a cough with sputum production.  Sputum is described as slightly green-tinged.  He reports subjective fevers and chills   ED Course: Vitals: Afebrile, normal pulse, no respirations, initially hypertensive and now normotensive.  On room air. Labs: Sodium 133, glucose of 119, lactic acid of 1.25, WBC 13.2 K Imaging: CT scans for right lower and middle pneumonia with associated empyema; right hilar adenopathy which is likely reactive. Medications/Course: Ceftriaxone, azithromycin, albuterol, Atrovent    Subjective: 6/1 A/O x4, negative S OB, positive right lateral CP (appropriate for chest tube).  Negative abdominal pain.   Assessment & Plan:   Active Problems:   Right lower lobe pneumonia (HCC)   Empyema (HCC)  RIGHT lower lobe (CAP)/RIGHT Empyema -Negative risk for HCAP - Cultures pending - Continue current antibiotics  COPD -Patient not officially diagnosed with COPD but heavy smoker/vape user.  High risk for COPD.   - Albuterol PRN  Tobacco abuse/vape abuse -Spoke at length with patient on need for total cessation - Nicotine patch      DVT prophylaxis: Lovenox Code Status: Full Family Communication: None Disposition Plan: Cardiothoracic surgery    Consultants:  Cardiothoracic surgery    Procedures/Significant Events:  5/31 RIGHT Thoracotomy with drainage of empyema and decortication of RIGHT lung     I have personally reviewed and interpreted  all radiology studies and my findings are as above.  VENTILATOR SETTINGS:    Cultures 5/29 blood NGTD 5/30 sputum reintubated for better growth 5/31 RIGHT pleural pending    Antimicrobials: Anti-infectives (From admission, onward)   Start     Stop   06/14/17 0000  vancomycin (VANCOCIN) IVPB 1000 mg/200 mL premix         06/13/17 1700  vancomycin (VANCOCIN) 2,000 mg in sodium chloride 0.9 % 500 mL IVPB     06/13/17 2006   06/13/17 1030  cefTRIAXone (ROCEPHIN) 2 g in dextrose 5 % 50 mL IVPB  Status:  Discontinued     06/13/17 1027   06/13/17 1030  cefTRIAXone (ROCEPHIN) 2 g in sodium chloride 0.9 % 100 mL IVPB  Status:  Discontinued     06/13/17 1633   06/12/17 1200  cefTRIAXone (ROCEPHIN) 2 g in sodium chloride 0.9 % 100 mL IVPB  Status:  Discontinued     06/12/17 1055   06/12/17 1200  azithromycin (ZITHROMAX) tablet 500 mg  Status:  Discontinued     06/12/17 1055   06/12/17 1200  Ampicillin-Sulbactam (UNASYN) 3 g in sodium chloride 0.9 % 100 mL IVPB         06/11/17 1800  piperacillin-tazobactam (ZOSYN) IVPB 3.375 g  Status:  Discontinued     06/11/17 2000   06/11/17 1215  cefTRIAXone (ROCEPHIN) 1 g in sodium chloride 0.9 % 100 mL IVPB     06/11/17 1321   06/11/17 1215  azithromycin (ZITHROMAX) 500 mg in sodium chloride 0.9 % 250 mL IVPB     06/11/17 1348       Devices    LINES /  TUBES:  32 French's RIGHT CT tube 5/31>>>      Continuous Infusions: . ampicillin-sulbactam (UNASYN) IV Stopped (06/14/17 0803)  . dextrose 5 % and 0.9% NaCl 125 mL/hr at 06/14/17 0753  . potassium chloride    . vancomycin Stopped (06/14/17 0127)     Objective: Vitals:   06/14/17 0700 06/14/17 0747 06/14/17 0748 06/14/17 0807  BP: (!) 114/103     Pulse: 72     Resp: 15 17 17    Temp:    97.6 F (36.4 C)  TempSrc:    Oral  SpO2: 97% 98% 98%   Weight:      Height:        Intake/Output Summary (Last 24 hours) at 06/14/2017 0848 Last data filed at 06/14/2017 0700 Gross per 24  hour  Intake 4420.83 ml  Output 2690 ml  Net 1730.83 ml   Filed Weights   06/11/17 0757 06/11/17 2100 06/12/17 2253  Weight: 260 lb (117.9 kg) 253 lb 12 oz (115.1 kg) 254 lb 13.6 oz (115.6 kg)    Examination:  General: A/O x4, positive acute respiratory distress Neck:  Negative scars, masses, torticollis, lymphadenopathy, JVD Lungs: Clear to auscultation bilaterally without wheezes or crackles, right CT tube in place negative air leak Cardiovascular: Regular rate and rhythm without murmur gallop or rub normal S1 and S2 Abdomen: negative abdominal pain, nondistended, positive soft, bowel sounds, no rebound, no ascites, no appreciable mass Extremities: No significant cyanosis, clubbing, or edema bilateral lower extremities Skin: Negative rashes, lesions, ulcers Psychiatric:  Negative depression, negative anxiety, negative fatigue, negative mania  Central nervous system:  Cranial nerves II through XII intact, tongue/uvula midline, all extremities muscle strength 5/5, sensation intact throughout,  negative dysarthria, negative expressive aphasia, negative receptive aphasia.  .     Data Reviewed: Care during the described time interval was provided by me .  I have reviewed this patient's available data, including medical history, events of note, physical examination, and all test results as part of my evaluation.   CBC: Recent Labs  Lab 06/11/17 1221 06/11/17 1229 06/14/17 0234  WBC 13.2*  --  9.2  NEUTROABS 10.1*  --   --   HGB 13.1 13.9 10.4*  HCT 38.4* 41.0 31.6*  MCV 96.2  --  96.6  PLT 382  --  397   Basic Metabolic Panel: Recent Labs  Lab 06/11/17 1229 06/14/17 0234  NA 133* 129*  K 4.0 3.9  CL 94* 95*  CO2  --  26  GLUCOSE 119* 163*  BUN 12 8  CREATININE 0.80 0.71  CALCIUM  --  7.5*   GFR: Estimated Creatinine Clearance: 171.6 mL/min (by C-G formula based on SCr of 0.71 mg/dL). Liver Function Tests: No results for input(s): AST, ALT, ALKPHOS, BILITOT, PROT,  ALBUMIN in the last 168 hours. No results for input(s): LIPASE, AMYLASE in the last 168 hours. No results for input(s): AMMONIA in the last 168 hours. Coagulation Profile: No results for input(s): INR, PROTIME in the last 168 hours. Cardiac Enzymes: No results for input(s): CKTOTAL, CKMB, CKMBINDEX, TROPONINI in the last 168 hours. BNP (last 3 results) No results for input(s): PROBNP in the last 8760 hours. HbA1C: No results for input(s): HGBA1C in the last 72 hours. CBG: Recent Labs  Lab 06/13/17 1729 06/13/17 2021 06/14/17 0021 06/14/17 0433 06/14/17 0805  GLUCAP 165* 141* 185* 146* 184*   Lipid Profile: No results for input(s): CHOL, HDL, LDLCALC, TRIG, CHOLHDL, LDLDIRECT in the last 72 hours. Thyroid Function  Tests: No results for input(s): TSH, T4TOTAL, FREET4, T3FREE, THYROIDAB in the last 72 hours. Anemia Panel: No results for input(s): VITAMINB12, FOLATE, FERRITIN, TIBC, IRON, RETICCTPCT in the last 72 hours. Urine analysis: No results found for: COLORURINE, APPEARANCEUR, LABSPEC, PHURINE, GLUCOSEU, HGBUR, BILIRUBINUR, KETONESUR, PROTEINUR, UROBILINOGEN, NITRITE, LEUKOCYTESUR Sepsis Labs: @LABRCNTIP (procalcitonin:4,lacticidven:4)  ) Recent Results (from the past 240 hour(s))  Culture, blood (routine x 2)     Status: None (Preliminary result)   Collection Time: 06/11/17  7:15 PM  Result Value Ref Range Status   Specimen Description   Final    BLOOD LEFT FOREARM Performed at [facility], [address], [place], [place] [address]    Special Requests   Final    BOTTLES DRAWN AEROBIC ONLY Blood Culture results may not be optimal due to an inadequate volume of blood received in culture bottles Performed at [facility], [address] [place], [place] [address]    Culture   Final    NO GROWTH 2 DAYS Performed at [facility], [address] [place], [place] [address]    Report Status PENDING  Incomplete  Culture,  blood (routine x 2)     Status: None (Preliminary result)   Collection Time: 06/11/17  7:16 PM  Result Value Ref Range Status   Specimen Description   Final    BLOOD RIGHT FOREARM Performed at [facility], [address], [place], [place] [address]    Special Requests   Final    BOTTLES DRAWN AEROBIC AND ANAEROBIC Blood Culture adequate volume Performed at [facility], [address] [place], [place] [address]    Culture   Final    NO GROWTH 2 DAYS Performed at [facility], [address], [place], [place] [address]    Report Status PENDING  Incomplete  MRSA PCR Screening     Status: None   Collection Time: 06/12/17  4:34 AM  Result Value Ref Range Status   MRSA by PCR NEGATIVE NEGATIVE Final    Comment:        The GeneXpert MRSA Assay (FDA approved for NASAL specimens only), is one component of a comprehensive MRSA colonization surveillance program. It is not intended to diagnose MRSA infection nor to guide or monitor treatment for MRSA infections. Performed at [facility], [address] [place], [place] [address]   Culture, expectorated sputum-assessment     Status: None   Collection Time: 06/12/17  9:45 AM  Result Value Ref Range Status   Specimen Description SPUTUM  Final   Special Requests NONE  Final   Sputum evaluation   Final    THIS SPECIMEN IS ACCEPTABLE FOR SPUTUM CULTURE Performed at [facility], [address], [place], [place] [address]    Report Status 06/12/2017 FINAL  Final  Culture, respiratory (NON-Expectorated)     Status: None (Preliminary result)   Collection Time: 06/12/17  9:45 AM  Result Value Ref Range Status   Specimen Description   Final    SPUTUM Performed at [facility], [address], [place], [place] [address]    Special Requests   Final    NONE Reflexed from [id number] Performed at [facility], [address], [place], [place] [address]    Gram Stain   Final    ABUNDANT WBC PRESENT, PREDOMINANTLY PMN FEW GRAM POSITIVE COCCI FEW GRAM VARIABLE ROD RARE YEAST    Culture  Final    CULTURE REINCUBATED FOR BETTER GROWTH Performed at [facility], [address], [place], [place] [address]    Report Status PENDING  Incomplete  Aerobic/Anaerobic Culture (surgical/deep wound)     Status: None (Preliminary result)   Collection Time: 06/13/17 11:08 AM  Result Value Ref Range Status   Specimen Description PLEURAL RIGHT  Final   Special Requests NONE  Final   Gram Stain   Final    ABUNDANT WBC PRESENT, PREDOMINANTLY PMN ABUNDANT GRAM POSITIVE COCCI IN PAIRS FEW GRAM NEGATIVE RODS Performed at [facility], [address] [place], [place] [address]    Culture PENDING  Incomplete   Report Status PENDING  Incomplete         Radiology Studies: Dg Chest Port 1 View  Result Date: 06/14/2017 CLINICAL DATA:  Followup.  Status post right thoracotomy. EXAM: PORTABLE CHEST 1 VIEW COMPARISON:  06/13/2017 and older exams. FINDINGS: Stable right lower hemithorax chest tubes. There is stable persistent right lung base opacity. Right upper lung is clear. No pneumothorax. Linear atelectasis extends laterally from the left hilum, stable. Left lung otherwise clear. The previously described malpositioned right internal jugular central venous line has been removed. Cardiac silhouette is normal size. IMPRESSION: 1. No change in right inferior hemithorax chest tubes or right lung base opacity when compared the prior exam. 2. No new lung abnormalities. 3. No pneumothorax. Electronically Signed   By: Amie Portland M.D.   On: 06/14/2017 08:10   Dg Chest Port 1 View  Result Date: 06/13/2017 CLINICAL DATA:  Status post right thoracotomy. EXAM: PORTABLE CHEST 1 VIEW COMPARISON:  Chest radiographs and chest CT obtained yesterday. FINDINGS: Normal sized heart. Chest tubes overlying the right lower chest  and upper abdomen with decreased right pleural fluid. There is some persistent patchy and ill-defined density in the right lower lung zone. Interval small amount of linear density in the left mid lung zone. Interval right jugular catheter with its tip overlying the upper right axilla. Decreased inspiration. Grossly normal sized heart. Unremarkable bones. IMPRESSION: 1. Malpositioned right jugular catheter with its tip in the right axillary vein. 2. Right basilar chest tubes with decreased pleural fluid and no pneumothorax. 3. Residual probable pneumonia at the right lung base. 4. Interval linear atelectasis on the left. These results will be called to the ordering clinician or representative by the Radiologist Assistant, and communication documented in the PACS or zVision Dashboard. Electronically Signed   By: Beckie Salts M.D.   On: 06/13/2017 12:41        Scheduled Meds: . acetaminophen  1,000 mg Oral Q6H   Or  . acetaminophen (TYLENOL) oral liquid 160 mg/5 mL  1,000 mg Oral Q6H  . albuterol  2.5 mg Nebulization Q4H while awake  . bisacodyl  10 mg Oral Daily  . enoxaparin (LOVENOX) injection  40 mg Subcutaneous Daily  . insulin aspart  0-24 Units Subcutaneous Q4H  . morphine   Intravenous Q4H  . nicotine  7 mg Transdermal Daily  . senna-docusate  1 tablet Oral QHS   Continuous Infusions: . ampicillin-sulbactam (UNASYN) IV Stopped (06/14/17 0803)  . dextrose 5 % and 0.9% NaCl 125 mL/hr at 06/14/17 0753  . potassium chloride    . vancomycin Stopped (06/14/17 0127)     LOS: 3 days    Time spent: 40 minutes    Catherina Pates, Roselind Messier, MD [facility] Pager [phone]   If 7PM-7AM, please contact night-coverage [email or web address] Password [facility] 06/14/2017,  8:48 AM

## 2017-06-15 ENCOUNTER — Inpatient Hospital Stay (HOSPITAL_COMMUNITY): Payer: Self-pay

## 2017-06-15 DIAGNOSIS — J180 Bronchopneumonia, unspecified organism: Secondary | ICD-10-CM

## 2017-06-15 LAB — CBC
HCT: 32.9 % — ABNORMAL LOW (ref 39.0–52.0)
Hemoglobin: 10.8 g/dL — ABNORMAL LOW (ref 13.0–17.0)
MCH: 31.2 pg (ref 26.0–34.0)
MCHC: 32.8 g/dL (ref 30.0–36.0)
MCV: 95.1 fL (ref 78.0–100.0)
PLATELETS: 543 10*3/uL — AB (ref 150–400)
RBC: 3.46 MIL/uL — ABNORMAL LOW (ref 4.22–5.81)
RDW: 11.8 % (ref 11.5–15.5)
WBC: 7.7 10*3/uL (ref 4.0–10.5)

## 2017-06-15 LAB — COMPREHENSIVE METABOLIC PANEL
ALT: 26 U/L (ref 17–63)
AST: 28 U/L (ref 15–41)
Albumin: 2.1 g/dL — ABNORMAL LOW (ref 3.5–5.0)
Alkaline Phosphatase: 49 U/L (ref 38–126)
Anion gap: 10 (ref 5–15)
BUN: 7 mg/dL (ref 6–20)
CHLORIDE: 97 mmol/L — AB (ref 101–111)
CO2: 27 mmol/L (ref 22–32)
Calcium: 7.7 mg/dL — ABNORMAL LOW (ref 8.9–10.3)
Creatinine, Ser: 0.64 mg/dL (ref 0.61–1.24)
GFR calc Af Amer: >60 mL/min (ref >60)
Glucose, Bld: 134 mg/dL — ABNORMAL HIGH (ref 65–99)
Potassium: 3.5 mmol/L (ref 3.5–5.1)
Sodium: 134 mmol/L — ABNORMAL LOW (ref 135–145)
Total Bilirubin: 0.2 mg/dL — ABNORMAL LOW (ref 0.3–1.2)
Total Protein: 6.5 g/dL (ref 6.5–8.1)

## 2017-06-15 LAB — CULTURE, RESPIRATORY

## 2017-06-15 LAB — CULTURE, RESPIRATORY W GRAM STAIN

## 2017-06-15 MED ORDER — ALBUTEROL SULFATE (2.5 MG/3ML) 0.083% IN NEBU: 2.5000 mg | INHALATION_SOLUTION | RESPIRATORY_TRACT | Status: DC | PRN

## 2017-06-15 NOTE — Progress Notes (Signed)
      [address]       [place] [id number]             [phone]        CARDIOTHORACIC SURGERY PROGRESS NOTE   R2 Days Post-Op Procedure(s) (LRB): VIDEO ASSISTED THORACOSCOPY (VATS)/DRAIN EMPYEMA (Right) THORACOTOMY MAJOR (Right)  Subjective: No complaints.  Mild soreness in chest  Objective: Vital signs: BP Readings from Last 1 Encounters:  06/15/17 118/65   Pulse Readings from Last 1 Encounters:  06/15/17 73   Resp Readings from Last 1 Encounters:  06/15/17 11   Temp Readings from Last 1 Encounters:  06/15/17 98.1 F (36.7 C) (Oral)    Hemodynamics:    Physical Exam:  Rhythm:   sinus  Breath sounds: clear  Heart sounds:  RRR  Incisions:  Dressings clean and dry  Abdomen:  Soft, non-distended, non-tender  Extremities:  Warm, well-perfused  Chest tubes:  low volume thin serosanguinous output, no air leak    Intake/Output from previous day: 06/01 0701 - 06/02 0700 In: 1450 [P.O.:225; I.V.:225; IV Piggyback:1000] Out: 1165 [Urine:960; Chest Tube:205] Intake/Output this shift: No intake/output data recorded.  Lab Results:  CBC: Recent Labs    06/14/17 0234 06/15/17 0655  WBC 9.2 7.7  HGB 10.4* 10.8*  HCT 31.6* 32.9*  PLT 397 543*    BMET:  Recent Labs    06/14/17 0234 06/15/17 0655  NA 129* 134*  K 3.9 3.5  CL 95* 97*  CO2 26 27  GLUCOSE 163* 134*  BUN 8 7  CREATININE 0.71 0.64  CALCIUM 7.5* 7.7*     PT/INR:  No results for input(s): LABPROT, INR in the last 72 hours.  CBG (last 3)  Recent Labs    06/14/17 0433 06/14/17 0805 06/14/17 1200  GLUCAP 146* 184* 142*    ABG    Component Value Date/Time   PHART 7.434 06/14/2017 0431   PCO2ART 39.8 06/14/2017 0431   PO2ART 92.0 06/14/2017 0431   HCO3 26.7 06/14/2017 0431   TCO2 28 06/14/2017 0431   O2SAT 97.0 06/14/2017 0431    CXR: PORTABLE CHEST 1 VIEW  COMPARISON:  June 14, 2017  FINDINGS: No change in the 2 right-sided chest tubes, pleural fluid,  or right basilar opacity in the interval. Atelectasis or scar remains in the left mid lung. No change in the cardiomediastinal silhouette. No pneumothorax. No other change.  IMPRESSION: 1. Stable right chest tubes, right pleural fluid, and right basilar opacity. No interval change.   Electronically Signed   By: Gerome Samavid  Williams III M.D   On: 06/15/2017 07:15   Assessment/Plan: S/P Procedure(s) (LRB): VIDEO ASSISTED THORACOSCOPY (VATS)/DRAIN EMPYEMA (Right) THORACOTOMY MAJOR (Right)  Doing well POD2 Pleural fluid cultures no growth at 1 day Mobilize D/C anterior tube Continue Vancomycin and Unasyn for now Await bed on 2C for transfer   Purcell Nailslarence H Aeralyn Barna, MD 06/15/2017 8:53 AM

## 2017-06-15 NOTE — Progress Notes (Signed)
PROGRESS NOTE    Andrew Mueller  ZOX:[id number] DOB: [birth year] DOA: 06/11/2017 PCP: Patient, No Pcp Per   Brief Narrative:  37 y.o. WM PMHx Tobacco abuse  Presented with 3-week history of right-sided chest pain.  He was evaluated in the emergency department 1-1/2 weeks ago with a diagnosis of pleurisy and prescribed analgesics, steroids, azithromycin.  Patient states that he took analgesics and steroids with associated worsening symptoms.  He has not taken azithromycin.  Last night, he reports having development of a cough with sputum production.  Sputum is described as slightly green-tinged.  He reports subjective fevers and chills   ED Course: Vitals: Afebrile, normal pulse, no respirations, initially hypertensive and now normotensive.  On room air. Labs: Sodium 133, glucose of 119, lactic acid of 1.25, WBC 13.2 K Imaging: CT scans for right lower and middle pneumonia with associated empyema; right hilar adenopathy which is likely reactive. Medications/Course: Ceftriaxone, azithromycin, albuterol, Atrovent    Subjective:  6/2/  A/O x4, negative S OB, positive right lateral CP pain, negative abdominal pain.   Assessment & Plan:   Active Problems:   Right lower lobe pneumonia (HCC)   Empyema (HCC)  RIGHT lower lobe (CAP)/RIGHT Empyema -Negative risk for HCAP - Cultures pending - Continue current antibiotics  COPD -Patient not officially diagnosed with COPD but heavy smoker/vape user.  High risk for COPD.   - Albuterol PRN  Tobacco abuse/vape abuse -Spoke at length with patient on need for total cessation - Nicotine patch      DVT prophylaxis: Lovenox Code Status: Full Family Communication: None Disposition Plan: Cardiothoracic surgery    Consultants:  Cardiothoracic surgery    Procedures/Significant Events:  5/31 RIGHT Thoracotomy with drainage of empyema and decortication of RIGHT lung     I have personally reviewed and interpreted all radiology studies  and my findings are as above.  VENTILATOR SETTINGS:    Cultures 5/29 blood NGTD 5/30 MRSA by PCR negative 5/30 sputum: Positive FEW CANDIDA TROPICALIS  5/31 RIGHT pleural: Recultured for better growth      Antimicrobials: Anti-infectives (From admission, onward)   Start     Stop   06/14/17 0000  vancomycin (VANCOCIN) IVPB 1000 mg/200 mL premix         06/13/17 1700  vancomycin (VANCOCIN) 2,000 mg in sodium chloride 0.9 % 500 mL IVPB     06/13/17 2006   06/13/17 1030  cefTRIAXone (ROCEPHIN) 2 g in dextrose 5 % 50 mL IVPB  Status:  Discontinued     06/13/17 1027   06/13/17 1030  cefTRIAXone (ROCEPHIN) 2 g in sodium chloride 0.9 % 100 mL IVPB  Status:  Discontinued     06/13/17 1633   06/12/17 1200  cefTRIAXone (ROCEPHIN) 2 g in sodium chloride 0.9 % 100 mL IVPB  Status:  Discontinued     06/12/17 1055   06/12/17 1200  azithromycin (ZITHROMAX) tablet 500 mg  Status:  Discontinued     06/12/17 1055   06/12/17 1200  Ampicillin-Sulbactam (UNASYN) 3 g in sodium chloride 0.9 % 100 mL IVPB         06/11/17 1800  piperacillin-tazobactam (ZOSYN) IVPB 3.375 g  Status:  Discontinued     06/11/17 2000   06/11/17 1215  cefTRIAXone (ROCEPHIN) 1 g in sodium chloride 0.9 % 100 mL IVPB     06/11/17 1321   06/11/17 1215  azithromycin (ZITHROMAX) 500 mg in sodium chloride 0.9 % 250 mL IVPB     06/11/17 1348  Devices    LINES / TUBES:  32 French's RIGHT CT tube 5/31>>>      Continuous Infusions: . ampicillin-sulbactam (UNASYN) IV Stopped (06/15/17 0558)  . dextrose 5 % and 0.9% NaCl 10 mL/hr at 06/14/17 1700  . potassium chloride    . vancomycin Stopped (06/15/17 0013)     Objective: Vitals:   06/15/17 0600 06/15/17 0619 06/15/17 0754 06/15/17 0817  BP: 123/70     Pulse: 81     Resp: 19 19    Temp:   98.1 F (36.7 C)   TempSrc:   Oral   SpO2: 96% 99%  100%  Weight:      Height:        Intake/Output Summary (Last 24 hours) at 06/15/2017 0829 Last data filed at  06/15/2017 0700 Gross per 24 hour  Intake 1100 ml  Output 1090 ml  Net 10 ml   Filed Weights   06/11/17 0757 06/11/17 2100 06/12/17 2253  Weight: 260 lb (117.9 kg) 253 lb 12 oz (115.1 kg) 254 lb 13.6 oz (115.6 kg)    Physical Exam:  General: A/O x4, positive acute respiratory distress Neck:  Negative scars, masses, torticollis, lymphadenopathy, JVD Lungs: Clear to auscultation bilaterally without wheezes or crackles, right CT tube in place negative air leak Cardiovascular: Regular rate and rhythm without murmur gallop or rub normal S1 and S2 Abdomen: negative abdominal pain, nondistended, positive soft, bowel sounds, no rebound, no ascites, no appreciable mass Extremities: No significant cyanosis, clubbing, or edema bilateral lower extremities Skin: Negative rashes, lesions, ulcers Psychiatric:  Negative depression, negative anxiety, negative fatigue, negative mania  Central nervous system:  Cranial nerves II through XII intact, tongue/uvula midline, all extremities muscle strength 5/5, sensation intact throughout,  negative dysarthria, negative expressive aphasia, negative receptive aphasia.  .     Data Reviewed: Care during the described time interval was provided by me .  I have reviewed this patient's available data, including medical history, events of note, physical examination, and all test results as part of my evaluation.   CBC: Recent Labs  Lab 06/11/17 1221 06/11/17 1229 06/14/17 0234 06/15/17 0655  WBC 13.2*  --  9.2 7.7  NEUTROABS 10.1*  --   --   --   HGB 13.1 13.9 10.4* 10.8*  HCT 38.4* 41.0 31.6* 32.9*  MCV 96.2  --  96.6 95.1  PLT 382  --  397 543*   Basic Metabolic Panel: Recent Labs  Lab 06/11/17 1229 06/14/17 0234 06/15/17 0655  NA 133* 129* 134*  K 4.0 3.9 3.5  CL 94* 95* 97*  CO2  --  26 27  GLUCOSE 119* 163* 134*  BUN 12 8 7   CREATININE 0.80 0.71 0.64  CALCIUM  --  7.5* 7.7*   GFR: Estimated Creatinine Clearance: 171.6 mL/min (by C-G  formula based on SCr of 0.64 mg/dL). Liver Function Tests: Recent Labs  Lab 06/15/17 0655  AST 28  ALT 26  ALKPHOS 49  BILITOT 0.2*  PROT 6.5  ALBUMIN 2.1*   No results for input(s): LIPASE, AMYLASE in the last 168 hours. No results for input(s): AMMONIA in the last 168 hours. Coagulation Profile: No results for input(s): INR, PROTIME in the last 168 hours. Cardiac Enzymes: No results for input(s): CKTOTAL, CKMB, CKMBINDEX, TROPONINI in the last 168 hours. BNP (last 3 results) No results for input(s): PROBNP in the last 8760 hours. HbA1C: No results for input(s): HGBA1C in the last 72 hours. CBG: Recent Labs  Lab 06/13/17  2021 06/14/17 0021 06/14/17 0433 06/14/17 0805 06/14/17 1200  GLUCAP 141* 185* 146* 184* 142*   Lipid Profile: No results for input(s): CHOL, HDL, LDLCALC, TRIG, CHOLHDL, LDLDIRECT in the last 72 hours. Thyroid Function Tests: No results for input(s): TSH, T4TOTAL, FREET4, T3FREE, THYROIDAB in the last 72 hours. Anemia Panel: No results for input(s): VITAMINB12, FOLATE, FERRITIN, TIBC, IRON, RETICCTPCT in the last 72 hours. Urine analysis: No results found for: COLORURINE, APPEARANCEUR, LABSPEC, PHURINE, GLUCOSEU, HGBUR, BILIRUBINUR, KETONESUR, PROTEINUR, UROBILINOGEN, NITRITE, LEUKOCYTESUR Sepsis Labs: @LABRCNTIP (procalcitonin:4,lacticidven:4)  ) Recent Results (from the past 240 hour(s))  Culture, blood (routine x 2)     Status: None (Preliminary result)   Collection Time: 06/11/17  7:15 PM  Result Value Ref Range Status   Specimen Description   Final    BLOOD LEFT FOREARM Performed at [facility], [address] [place], [place] [address]    Special Requests   Final    BOTTLES DRAWN AEROBIC ONLY Blood Culture results may not be optimal due to an inadequate volume of blood received in culture bottles Performed at [facility], [address], [place], [place] [address]    Culture   Final    NO GROWTH  3 DAYS Performed at [facility], [address], [place], [place] [address]    Report Status PENDING  Incomplete  Culture, blood (routine x 2)     Status: None (Preliminary result)   Collection Time: 06/11/17  7:16 PM  Result Value Ref Range Status   Specimen Description   Final    BLOOD RIGHT FOREARM Performed at [facility], [address] [place], [place] [address]    Special Requests   Final    BOTTLES DRAWN AEROBIC AND ANAEROBIC Blood Culture adequate volume Performed at [facility], [address] [place], [place] [address]    Culture   Final    NO GROWTH 3 DAYS Performed at [facility], [address], [place], [place] [address]    Report Status PENDING  Incomplete  MRSA PCR Screening     Status: None   Collection Time: 06/12/17  4:34 AM  Result Value Ref Range Status   MRSA by PCR NEGATIVE NEGATIVE Final    Comment:        The GeneXpert MRSA Assay (FDA approved for NASAL specimens only), is one component of a comprehensive MRSA colonization surveillance program. It is not intended to diagnose MRSA infection nor to guide or monitor treatment for MRSA infections. Performed at [facility], [address], [place], [place] [address]   Culture, expectorated sputum-assessment     Status: None   Collection Time: 06/12/17  9:45 AM  Result Value Ref Range Status   Specimen Description SPUTUM  Final   Special Requests NONE  Final   Sputum evaluation   Final    THIS SPECIMEN IS ACCEPTABLE FOR SPUTUM CULTURE Performed at [facility], [address] [place], [place] [address]    Report Status 06/12/2017 FINAL  Final  Culture, respiratory (NON-Expectorated)     Status: None (Preliminary result)   Collection Time: 06/12/17  9:45 AM  Result Value Ref Range Status   Specimen Description   Final    SPUTUM Performed at [facility], [address],  [place], [place] [address]    Special Requests   Final    NONE Reflexed from [id number] Performed at [facility],  [address] [place], [place] [address]    Gram Stain   Final    ABUNDANT WBC PRESENT, PREDOMINANTLY PMN FEW GRAM POSITIVE COCCI FEW GRAM VARIABLE ROD RARE YEAST    Culture   Final    CULTURE REINCUBATED FOR BETTER GROWTH Performed at [facility], [address], [place], [place] [address]    Report Status PENDING  Incomplete  Aerobic/Anaerobic Culture (surgical/deep wound)     Status: None (Preliminary result)   Collection Time: 06/13/17 11:08 AM  Result Value Ref Range Status   Specimen Description PLEURAL RIGHT  Final   Special Requests NONE  Final   Gram Stain   Final    ABUNDANT WBC PRESENT, PREDOMINANTLY PMN ABUNDANT GRAM POSITIVE COCCI IN PAIRS FEW GRAM NEGATIVE RODS    Culture   Final    NO GROWTH 1 DAY Performed at [facility], [address] [place], [place] [address]    Report Status PENDING  Incomplete  Acid Fast Smear (AFB)     Status: None   Collection Time: 06/13/17 11:08 AM  Result Value Ref Range Status   AFB Specimen Processing Concentration  Final   Acid Fast Smear Negative  Final    Comment: (NOTE) Performed At: [facility] [address] [place], [place] [id number] Jolene Schimke MD GE:[id number]    Source (AFB) PLEURAL  Final    Comment: Performed at [facility], [address] [place], [place] [address]         Radiology Studies: Dg Chest Port 1 View  Result Date: 06/15/2017 CLINICAL DATA:  Empyema EXAM: PORTABLE CHEST 1 VIEW COMPARISON:  June 14, 2017 FINDINGS: No change in the 2 right-sided chest tubes, pleural fluid, or right basilar opacity in the interval. Atelectasis or scar remains in the left mid lung. No change in the cardiomediastinal silhouette. No pneumothorax. No other change. IMPRESSION: 1. Stable right chest tubes, right pleural fluid, and right basilar opacity. No  interval change. Electronically Signed   By: Gerome Sam III M.D   On: 06/15/2017 07:15   Dg Chest Port 1 View  Result Date: 06/14/2017 CLINICAL DATA:  Followup.  Status post right thoracotomy. EXAM: PORTABLE CHEST 1 VIEW COMPARISON:  06/13/2017 and older exams. FINDINGS: Stable right lower hemithorax chest tubes. There is stable persistent right lung base opacity. Right upper lung is clear. No pneumothorax. Linear atelectasis extends laterally from the left hilum, stable. Left lung otherwise clear. The previously described malpositioned right internal jugular central venous line has been removed. Cardiac silhouette is normal size. IMPRESSION: 1. No change in right inferior hemithorax chest tubes or right lung base opacity when compared the prior exam. 2. No new lung abnormalities. 3. No pneumothorax. Electronically Signed   By: Amie Portland M.D.   On: 06/14/2017 08:10   Dg Chest Port 1 View  Result Date: 06/13/2017 CLINICAL DATA:  Status post right thoracotomy. EXAM: PORTABLE CHEST 1 VIEW COMPARISON:  Chest radiographs and chest CT obtained yesterday. FINDINGS: Normal sized heart. Chest tubes overlying the right lower chest and upper abdomen with decreased right pleural fluid. There is some persistent patchy and ill-defined density in the right lower lung zone. Interval small amount of linear density in the left mid lung zone. Interval right jugular catheter with its tip overlying the upper right axilla. Decreased inspiration. Grossly normal sized heart. Unremarkable bones. IMPRESSION: 1. Malpositioned right jugular catheter with its tip in the right axillary vein. 2. Right basilar chest tubes  with decreased pleural fluid and no pneumothorax. 3. Residual probable pneumonia at the right lung base. 4. Interval linear atelectasis on the left. These results will be called to the ordering clinician or representative by the Radiologist Assistant, and communication documented in the PACS or zVision Dashboard.  Electronically Signed   By: Beckie SaltsSteven  Reid M.D.   On: 06/13/2017 12:41        Scheduled Meds: . acetaminophen  1,000 mg Oral Q6H   Or  . acetaminophen (TYLENOL) oral liquid 160 mg/5 mL  1,000 mg Oral Q6H  . albuterol  2.5 mg Nebulization Q4H while awake  . bisacodyl  10 mg Oral Daily  . enoxaparin (LOVENOX) injection  40 mg Subcutaneous Daily  . morphine   Intravenous Q4H  . nicotine  7 mg Transdermal Daily  . senna-docusate  1 tablet Oral QHS   Continuous Infusions: . ampicillin-sulbactam (UNASYN) IV Stopped (06/15/17 0558)  . dextrose 5 % and 0.9% NaCl 10 mL/hr at 06/14/17 1700  . potassium chloride    . vancomycin Stopped (06/15/17 0013)     LOS: 4 days    Time spent: 40 minutes    WOODS, Roselind MessierURTIS J, MD [facility] Pager [phone]   If 7PM-7AM, please contact night-coverage [email or web address] Password [email or web address] 06/15/2017, 8:29 AM

## 2017-06-16 ENCOUNTER — Inpatient Hospital Stay (HOSPITAL_COMMUNITY): Payer: Self-pay

## 2017-06-16 DIAGNOSIS — R739 Hyperglycemia, unspecified: Secondary | ICD-10-CM

## 2017-06-16 LAB — CULTURE, BLOOD (ROUTINE X 2)
Culture: NO GROWTH
Culture: NO GROWTH
Special Requests: ADEQUATE

## 2017-06-16 LAB — VANCOMYCIN, TROUGH: Vancomycin Tr: 12 ug/mL — ABNORMAL LOW (ref 15–20)

## 2017-06-16 MED ORDER — ALBUTEROL SULFATE (2.5 MG/3ML) 0.083% IN NEBU: 2.5000 mg | INHALATION_SOLUTION | RESPIRATORY_TRACT | Status: DC | PRN

## 2017-06-16 MED ORDER — VANCOMYCIN HCL 10 G IV SOLR
1500.0000 mg | Freq: Three times a day (TID) | INTRAVENOUS | Status: DC
  Administered 2017-06-16 – 2017-06-18 (×6): 1500 mg via INTRAVENOUS
  Filled 2017-06-16 (×7): qty 1500

## 2017-06-16 NOTE — Plan of Care (Signed)

## 2017-06-16 NOTE — Progress Notes (Signed)
[place] TEAM 1 - Stepdown/ICU TEAM  Andrew Mueller  XBJ:[id number] DOB: 04/10/1980 DOA: 06/11/2017 PCP: Patient, No Pcp Per    Brief Narrative:  37yo M w/ a Hx of Tobacco abuse who presented with 3-week history of right-sided chest pain.  He was initially seen in the ED 1 1/2 weeks prior to this admit with a diagnosis of pleurisy, tx with analgesics, steroids, azithromycin. He chose not to take the abx, and his sx only worsened w/ use of steroids.    Significant Events: 5/29 admit - R empyema  5/31 VATS w/ decortication   Subjective: Resting comfortably in bed.  No resp distress.  Reports some expected chest wall pain but states is not severe.  Fair appetite.  No n/v, abdom pain, or signif SOB.    Assessment & Plan:  RLL Pneumonia w/ Empyema S/p VATS - post-op care as per TCTS - narrow abx as culture allows   Possible COPD not officially diagnosed with COPD but heavy smoker/vape user - would benefit from outpt PFTs once fully recovered   Tobacco abuse/vape abuse Has been counseled at length on need for total cessation  Hyperglycemia  Check A1c  Obesity - Body mass index is 36.24 kg/m.   DVT prophylaxis: lovenox  Code Status: FULL CODE Family Communication: no family present at time of exam  Disposition Plan: SDU   Consultants:  TCTS  Antimicrobials:  Unasyn 5/30 > Vanc 5/31 > Zosyn 5/29   Objective: Blood pressure 131/78, pulse 88, temperature 98.6 F (37 C), temperature source Oral, resp. rate 16, height 6' (1.829 m), weight 121.2 kg (267 lb 3.2 oz), SpO2 92 %.  Intake/Output Summary (Last 24 hours) at 06/16/2017 1053 Last data filed at 06/16/2017 0644 Gross per 24 hour  Intake 1280 ml  Output 1820 ml  Net -540 ml   Filed Weights   06/11/17 2100 06/12/17 2253 06/15/17 2052  Weight: 115.1 kg (253 lb 12 oz) 115.6 kg (254 lb 13.6 oz) 121.2 kg (267 lb 3.2 oz)    Examination: General: No acute respiratory distress Lungs: mild crackles R base - no wheezing   Cardiovascular: Regular rate and rhythm without murmur gallop or rub normal S1 and S2 Abdomen: Nontender, nondistended, soft, bowel sounds positive, no rebound, no ascites, no appreciable mass Extremities: No significant cyanosis, clubbing, or edema bilateral lower extremities  CBC: Recent Labs  Lab 06/11/17 1221 06/11/17 1229 06/14/17 0234 06/15/17 0655  WBC 13.2*  --  9.2 7.7  NEUTROABS 10.1*  --   --   --   HGB 13.1 13.9 10.4* 10.8*  HCT 38.4* 41.0 31.6* 32.9*  MCV 96.2  --  96.6 95.1  PLT 382  --  397 543*   Basic Metabolic Panel: Recent Labs  Lab 06/11/17 1229 06/14/17 0234 06/15/17 0655  NA 133* 129* 134*  K 4.0 3.9 3.5  CL 94* 95* 97*  CO2  --  26 27  GLUCOSE 119* 163* 134*  BUN 12 8 7   CREATININE 0.80 0.71 0.64  CALCIUM  --  7.5* 7.7*   GFR: Estimated Creatinine Clearance: 168.2 mL/min (by C-G formula based on SCr of 0.64 mg/dL).  Liver Function Tests: Recent Labs  Lab 06/15/17 0655  AST 28  ALT 26  ALKPHOS 49  BILITOT 0.2*  PROT 6.5  ALBUMIN 2.1*   CBG: Recent Labs  Lab 06/13/17 2021 06/14/17 0021 06/14/17 0433 06/14/17 0805 06/14/17 1200  GLUCAP 141* 185* 146* 184* 142*    Recent Results (from the  past 240 hour(s))  Culture, blood (routine x 2)     Status: None (Preliminary result)   Collection Time: 06/11/17  7:15 PM  Result Value Ref Range Status   Specimen Description   Final    BLOOD LEFT FOREARM Performed at [facility], [address], [place], [place] [address]    Special Requests   Final    BOTTLES DRAWN AEROBIC ONLY Blood Culture results may not be optimal due to an inadequate volume of blood received in culture bottles Performed at [facility], [address] [place], [place] [address]    Culture   Final    NO GROWTH 4 DAYS Performed at [facility], [address] [place], [place] [address]    Report Status PENDING  Incomplete  Culture, blood (routine x 2)     Status: None  (Preliminary result)   Collection Time: 06/11/17  7:16 PM  Result Value Ref Range Status   Specimen Description   Final    BLOOD RIGHT FOREARM Performed at [facility], [address], [place], [place] [address]    Special Requests   Final    BOTTLES DRAWN AEROBIC AND ANAEROBIC Blood Culture adequate volume Performed at [facility], [address] [place], [place] [address]    Culture   Final    NO GROWTH 4 DAYS Performed at [facility], [address], [place], [place] [address]    Report Status PENDING  Incomplete  MRSA PCR Screening     Status: None   Collection Time: 06/12/17  4:34 AM  Result Value Ref Range Status   MRSA by PCR NEGATIVE NEGATIVE Final    Comment:        The GeneXpert MRSA Assay (FDA approved for NASAL specimens only), is one component of a comprehensive MRSA colonization surveillance program. It is not intended to diagnose MRSA infection nor to guide or monitor treatment for MRSA infections. Performed at [facility], [address], [place], [place] [address]   Culture, expectorated sputum-assessment     Status: None   Collection Time: 06/12/17  9:45 AM  Result Value Ref Range Status   Specimen Description SPUTUM  Final   Special Requests NONE  Final   Sputum evaluation   Final    THIS SPECIMEN IS ACCEPTABLE FOR SPUTUM CULTURE Performed at [facility], [address] [place], [place] [address]    Report Status 06/12/2017 FINAL  Final  Culture, respiratory (NON-Expectorated)     Status: None   Collection Time: 06/12/17  9:45 AM  Result Value Ref Range Status   Specimen Description   Final    SPUTUM Performed at [facility], [address], [place], [place] [address]    Special Requests   Final    NONE Reflexed from [id number] Performed at [facility], [address] [place], [place] [address]    Gram Stain   Final     ABUNDANT WBC PRESENT, PREDOMINANTLY PMN FEW GRAM POSITIVE COCCI FEW GRAM VARIABLE ROD RARE YEAST Performed at [facility], [address], [place], [place] [address]    Culture FEW CANDIDA TROPICALIS  Final   Report Status 06/15/2017 FINAL  Final  Aerobic/Anaerobic Culture (surgical/deep wound)     Status: None (Preliminary result)   Collection Time: 06/13/17 11:08 AM  Result Value Ref Range Status   Specimen Description PLEURAL RIGHT  Final  Special Requests NONE  Final   Gram Stain   Final    ABUNDANT WBC PRESENT, PREDOMINANTLY PMN ABUNDANT GRAM POSITIVE COCCI IN PAIRS FEW GRAM NEGATIVE RODS    Culture   Final    CULTURE REINCUBATED FOR BETTER GROWTH HOLDING FOR POSSIBLE ANAEROBE Performed at [facility], [address], [place], [place] [address]    Report Status PENDING  Incomplete  Acid Fast Smear (AFB)     Status: None   Collection Time: 06/13/17 11:08 AM  Result Value Ref Range Status   AFB Specimen Processing Concentration  Final   Acid Fast Smear Negative  Final    Comment: (NOTE) Performed At: [facility] [address] [place], [place] [id number] Jolene Schimke MD AO:[id number]    Source (AFB) PLEURAL  Final    Comment: Performed at [facility], [address], [place], [place] [address]     Scheduled Meds: . acetaminophen  1,000 mg Oral Q6H   Or  . acetaminophen (TYLENOL) oral liquid 160 mg/5 mL  1,000 mg Oral Q6H  . bisacodyl  10 mg Oral Daily  . enoxaparin (LOVENOX) injection  40 mg Subcutaneous Daily  . morphine   Intravenous Q4H  . nicotine  7 mg Transdermal Daily  . senna-docusate  1 tablet Oral QHS   Continuous Infusions: . ampicillin-sulbactam (UNASYN) IV Stopped (06/16/17 0710)  . dextrose 5 % and 0.9% NaCl 10 mL/hr at 06/16/17 0300  . potassium chloride    . vancomycin 1,000 mg (06/16/17 0949)     LOS: 5 days   Lonia Blood, MD [facility] Office  [phone] Pager - Text Page per Amion as  per below:  On-Call/Text Page:      [email or web address]      password [email or web address]  If 7PM-7AM, please contact night-coverage [email or web address] Password [facility] 06/16/2017, 10:53 AM

## 2017-06-16 NOTE — Plan of Care (Signed)
Patient stated that his pain is less than it was yesterday.  It took a lot of encouraging to get patient up to walk, but he walked two laps this afternoon.  O2 has been weaned down to 2L will continue to attempt to wean O2 off.

## 2017-06-16 NOTE — Progress Notes (Signed)
Pharmacy Antibiotic Note  Andrew Mueller is a 37 y.o. male admitted on 06/11/2017 with pneumonia.  Pharmacy has been consulted for vancomycin dosing.  Pt s/p OR on 5/31 for VATS and empyema drain.  Afebrile overnight, wbc down to 7, renal function has remained normal with good uop documented.   Vancomycin trough this morning is below goal at 12 and this level was drawn ~5 hours after dose so true level likely much lower.   Plan: Increase vancomycin to 1500mg  IV every 8 hours.  Goal trough 15-20 mcg/mL.  Recheck Vancomycin level next 24-48 hours Monitor clinical progression and LOT  Height: 6' (182.9 cm) Weight: 267 lb 3.2 oz (121.2 kg) IBW/kg (Calculated) : 77.6  Temp (24hrs), Avg:98.3 F (36.8 C), Min:97.8 F (36.6 C), Max:98.8 F (37.1 C)  Recent Labs  Lab 06/11/17 1221 06/11/17 1229 06/14/17 0234 06/15/17 0655 06/16/17 0629  WBC 13.2*  --  9.2 7.7  --   CREATININE  --  0.80 0.71 0.64  --   LATICACIDVEN  --  1.25  --   --   --   VANCOTROUGH  --   --   --   --  12*    Estimated Creatinine Clearance: 168.2 mL/min (by C-G formula based on SCr of 0.64 mg/dL).    No Known Allergies  Thank you for allowing pharmacy to be a part of this patient's care.  Sheppard CoilFrank Wilson PharmD., BCPS Clinical Pharmacist 06/16/2017 11:01 AM

## 2017-06-16 NOTE — Plan of Care (Signed)
Pt. Is progressing. Continue with plan of care. 

## 2017-06-16 NOTE — Progress Notes (Addendum)
[address]       [place] [address]             [phone]      3 Days Post-Op Procedure(s) (LRB): VIDEO ASSISTED THORACOSCOPY (VATS)/DRAIN EMPYEMA (Right) THORACOTOMY MAJOR (Right) Subjective: Feels a little better, sputum production less, + BM this am  Objective: Vital signs in last 24 hours: Temp:  [97.8 F (36.6 C)-98.8 F (37.1 C)] 98.4 F (36.9 C) (06/03 0338) Pulse Rate:  [72-90] 88 (06/02 2052) Cardiac Rhythm: Normal sinus rhythm (06/03 0300) Resp:  [11-24] 15 (06/03 0300) BP: (113-137)/(64-83) 113/64 (06/03 0300) SpO2:  [95 %-100 %] 98 % (06/03 0130) Weight:  [121.2 kg (267 lb 3.2 oz)] 121.2 kg (267 lb 3.2 oz) (06/02 2052)  Hemodynamic parameters for last 24 hours:    Intake/Output from previous day: 06/02 0701 - 06/03 0700 In: 1280 [P.O.:240; I.V.:340; IV Piggyback:700] Out: 1820 [Urine:1800; Chest Tube:20] Intake/Output this shift: No intake/output data recorded.  General appearance: alert, cooperative, fatigued and no distress Heart: regular rate and rhythm Lungs: coarse, dim in right base Abdomen: benign Extremities: no edema or calf tenderness Wound: dressings CDI  Lab Results: Recent Labs    06/14/17 0234 06/15/17 0655  WBC 9.2 7.7  HGB 10.4* 10.8*  HCT 31.6* 32.9*  PLT 397 543*   BMET:  Recent Labs    06/14/17 0234 06/15/17 0655  NA 129* 134*  K 3.9 3.5  CL 95* 97*  CO2 26 27  GLUCOSE 163* 134*  BUN 8 7  CREATININE 0.71 0.64  CALCIUM 7.5* 7.7*    PT/INR: No results for input(s): LABPROT, INR in the last 72 hours. ABG    Component Value Date/Time   PHART 7.434 06/14/2017 0431   HCO3 26.7 06/14/2017 0431   TCO2 28 06/14/2017 0431   O2SAT 97.0 06/14/2017 0431   CBG (last 3)  Recent Labs    06/14/17 0433 06/14/17 0805 06/14/17 1200  GLUCAP 146* 184* 142*   Results for orders placed or performed during the hospital encounter of 06/11/17  Culture, blood (routine x 2)     Status: None (Preliminary  result)   Collection Time: 06/11/17  7:15 PM  Result Value Ref Range Status   Specimen Description   Final    BLOOD LEFT FOREARM Performed at [facility], [address], [place], [place] [address]    Special Requests   Final    BOTTLES DRAWN AEROBIC ONLY Blood Culture results may not be optimal due to an inadequate volume of blood received in culture bottles Performed at [facility], [address], [place], [place] [address]    Culture   Final    NO GROWTH 4 DAYS Performed at [facility], [address], [place], [place] [address]    Report Status PENDING  Incomplete  Culture, blood (routine x 2)     Status: None (Preliminary result)   Collection Time: 06/11/17  7:16 PM  Result Value Ref Range Status   Specimen Description   Final    BLOOD RIGHT FOREARM Performed at [facility], [address] [place], [place] [address]    Special Requests   Final    BOTTLES DRAWN AEROBIC AND ANAEROBIC Blood Culture adequate volume Performed at [facility], [address] [place], [place] [address]    Culture   Final    NO GROWTH 4 DAYS Performed at [facility],  [address] [place], [place] [address]    Report Status PENDING  Incomplete  MRSA PCR Screening     Status: None   Collection Time: 06/12/17  4:34 AM  Result Value Ref Range Status   MRSA by PCR NEGATIVE NEGATIVE Final    Comment:        The GeneXpert MRSA Assay (FDA approved for NASAL specimens only), is one component of a comprehensive MRSA colonization surveillance program. It is not intended to diagnose MRSA infection nor to guide or monitor treatment for MRSA infections. Performed at [facility], [address] [place], [place] [address]   Culture, expectorated sputum-assessment     Status: None   Collection Time: 06/12/17  9:45 AM  Result Value Ref Range Status   Specimen Description SPUTUM  Final     Special Requests NONE  Final   Sputum evaluation   Final    THIS SPECIMEN IS ACCEPTABLE FOR SPUTUM CULTURE Performed at [facility], [address] [place], [place] [address]    Report Status 06/12/2017 FINAL  Final  Culture, respiratory (NON-Expectorated)     Status: None   Collection Time: 06/12/17  9:45 AM  Result Value Ref Range Status   Specimen Description   Final    SPUTUM Performed at [facility], [address] [place], [place] [address]    Special Requests   Final    NONE Reflexed from [id number] Performed at [facility], [address] [place], [place] [address]    Gram Stain   Final    ABUNDANT WBC PRESENT, PREDOMINANTLY PMN FEW GRAM POSITIVE COCCI FEW GRAM VARIABLE ROD RARE YEAST Performed at [facility], [address] [place], [place] [address]    Culture FEW CANDIDA TROPICALIS  Final   Report Status 06/15/2017 FINAL  Final  Aerobic/Anaerobic Culture (surgical/deep wound)     Status: None (Preliminary result)   Collection Time: 06/13/17 11:08 AM  Result Value Ref Range Status   Specimen Description PLEURAL RIGHT  Final   Special Requests NONE  Final   Gram Stain   Final    ABUNDANT WBC PRESENT, PREDOMINANTLY PMN ABUNDANT GRAM POSITIVE COCCI IN PAIRS FEW GRAM NEGATIVE RODS    Culture   Final    CULTURE REINCUBATED FOR BETTER GROWTH HOLDING FOR POSSIBLE ANAEROBE Performed at [facility], [address] [place], [place] [address]    Report Status PENDING  Incomplete  Acid Fast Smear (AFB)     Status: None   Collection Time: 06/13/17 11:08 AM  Result Value Ref Range Status   AFB Specimen Processing Concentration  Final   Acid Fast Smear Negative  Final    Comment: (NOTE) Performed At: [facility] [address] [place], [place] [address] Jolene SchimkeNagendra Sanjai MD UU:[id number]:[phone]    Source (AFB) PLEURAL  Final    Comment: Performed at [facility], [address]  [place], [place] [address]   Meds Scheduled Meds: . acetaminophen  1,000 mg Oral Q6H   Or  . acetaminophen (TYLENOL) oral liquid 160 mg/5 mL  1,000 mg Oral Q6H  . bisacodyl  10 mg Oral Daily  . enoxaparin (LOVENOX) injection  40 mg Subcutaneous Daily  . morphine   Intravenous Q4H  . nicotine  7 mg Transdermal Daily  . senna-docusate  1 tablet Oral QHS   Continuous Infusions: . ampicillin-sulbactam (UNASYN) IV Stopped (06/16/17 0710)  . dextrose 5 % and 0.9%  NaCl 10 mL/hr at 06/16/17 0300  . potassium chloride    . vancomycin Stopped (06/16/17 0231)   PRN Meds:.albuterol, diphenhydrAMINE **OR** diphenhydrAMINE, ketorolac, naloxone **AND** sodium chloride flush, ondansetron (ZOFRAN) IV, oxyCODONE, pneumococcal 23 valent vaccine, potassium chloride, traMADol  Xrays Dg Chest Port 1 View  Result Date: 06/16/2017 CLINICAL DATA:  Empyema EXAM: PORTABLE CHEST 1 VIEW COMPARISON:  Yesterday FINDINGS: 1 of 2 right base chest tubes remain. Unchanged low volumes with pleuroparenchymal opacity at the right base. Normal heart size. No pneumothorax. IMPRESSION: Stable low volumes and pleuroparenchymal opacity at the right base. No pneumothorax. Electronically Signed   By: Marnee Spring M.D.   On: 06/16/2017 07:20   Dg Chest Port 1 View  Result Date: 06/15/2017 CLINICAL DATA:  Empyema EXAM: PORTABLE CHEST 1 VIEW COMPARISON:  June 14, 2017 FINDINGS: No change in the 2 right-sided chest tubes, pleural fluid, or right basilar opacity in the interval. Atelectasis or scar remains in the left mid lung. No change in the cardiomediastinal silhouette. No pneumothorax. No other change. IMPRESSION: 1. Stable right chest tubes, right pleural fluid, and right basilar opacity. No interval change. Electronically Signed   By: Gerome Sam III M.D   On: 06/15/2017 07:15    Assessment/Plan: S/P Procedure(s) (LRB): VIDEO ASSISTED THORACOSCOPY (VATS)/DRAIN EMPYEMA (Right) THORACOTOMY MAJOR (Right)   1 steady progress 2  minimal CT drainage, 40 cc yesterday, 20 today- serosang 3 CT no air leak- place to H2O seal and poss d/c later today, push rehab and pulm toilet as able, cont nebs. Keep PCA till tube out 4 ABX, medical management as per primary 5 lovenox for DVT proph  LOS: 5 days    Rowe Clack 06/16/2017   Chart reviewed, patient examined, agree with above. Will remove chest tube in am and dc PCA and IVF so that he can mobilize easier. CXR shows stable pleuroparenchymal opacity at the right base due to pneumonia and RLL and pleural thickening. Continue antibiotics and IS, ambulation.

## 2017-06-17 ENCOUNTER — Inpatient Hospital Stay (HOSPITAL_COMMUNITY): Payer: Self-pay

## 2017-06-17 LAB — COMPREHENSIVE METABOLIC PANEL
ALK PHOS: 46 U/L (ref 38–126)
ALT: 25 U/L (ref 17–63)
AST: 26 U/L (ref 15–41)
Albumin: 2 g/dL — ABNORMAL LOW (ref 3.5–5.0)
Anion gap: 7 (ref 5–15)
BUN: 9 mg/dL (ref 6–20)
CALCIUM: 7.9 mg/dL — AB (ref 8.9–10.3)
CHLORIDE: 101 mmol/L (ref 101–111)
CO2: 27 mmol/L (ref 22–32)
CREATININE: 0.73 mg/dL (ref 0.61–1.24)
GFR calc non Af Amer: >60 mL/min (ref >60)
Glucose, Bld: 184 mg/dL — ABNORMAL HIGH (ref 65–99)
Potassium: 3.4 mmol/L — ABNORMAL LOW (ref 3.5–5.1)
SODIUM: 135 mmol/L (ref 135–145)
Total Bilirubin: 0.3 mg/dL (ref 0.3–1.2)
Total Protein: 6.9 g/dL (ref 6.5–8.1)

## 2017-06-17 LAB — CBC
HCT: 31.7 % — ABNORMAL LOW (ref 39.0–52.0)
Hemoglobin: 10.3 g/dL — ABNORMAL LOW (ref 13.0–17.0)
MCH: 31.9 pg (ref 26.0–34.0)
MCHC: 32.5 g/dL (ref 30.0–36.0)
MCV: 98.1 fL (ref 78.0–100.0)
PLATELETS: 512 10*3/uL — AB (ref 150–400)
RBC: 3.23 MIL/uL — AB (ref 4.22–5.81)
RDW: 11.9 % (ref 11.5–15.5)
WBC: 6 10*3/uL (ref 4.0–10.5)

## 2017-06-17 LAB — HEMOGLOBIN A1C
Hgb A1c MFr Bld: 6.3 % — ABNORMAL HIGH (ref 4.8–5.6)
Mean Plasma Glucose: 134.11 mg/dL

## 2017-06-17 MED ORDER — POTASSIUM CHLORIDE CRYS ER 20 MEQ PO TBCR
40.0000 meq | EXTENDED_RELEASE_TABLET | Freq: Once | ORAL | Status: AC
  Administered 2017-06-17: 40 meq via ORAL
  Filled 2017-06-17: qty 2

## 2017-06-17 MED ORDER — ENOXAPARIN SODIUM 40 MG/0.4ML ~~LOC~~ SOLN
40.0000 mg | SUBCUTANEOUS | Status: DC
  Administered 2017-06-18: 40 mg via SUBCUTANEOUS
  Filled 2017-06-17: qty 0.4

## 2017-06-17 NOTE — Evaluation (Signed)
Physical Therapy Evaluation/Discharge Patient Details Name: Andrew Mueller MRN: [id number] DOB: March 25, 1980 Today's Date: 06/17/2017   History of Present Illness  37 yo smoker admitted 5/29 with cough, CP and Rt empyema s/p VATS 5/31. Pt presented to ED 5/18 with pleurisy and didn't take antibiotics with return on the 29th with increase in symptoms. No other significant PMHx  Clinical Impression  Pt pleasant and willing to participate with PT. Pt ambulated 500 ft mod I (400 ft with no AD) and ascended 3 stairs mod I. Pt demonstrates no need for acute PT as mobility and balance are Mod I. HR reached max of 107 bpm during activity. Pt agreeable to D/C from PT services.     Follow Up Recommendations No PT follow up    Equipment Recommendations  None recommended by PT    Recommendations for Other Services       Precautions / Restrictions Precautions Precautions: None      Mobility  Bed Mobility Overal bed mobility: Modified Independent             General bed mobility comments: increased time with use of rail with HOB 20degrees  Transfers Overall transfer level: Modified independent Equipment used: Rolling walker (2 wheeled)             General transfer comment: Increased time and RW used  Ambulation/Gait Ambulation/Gait assistance: Modified independent (Device/Increase time) Ambulation Distance (Feet): 500 Feet Assistive device: Rolling walker (2 wheeled);None Gait Pattern/deviations: Step-through pattern;Decreased stride length Gait velocity: Decreased   General Gait Details: Pt ambulated 100 ft with RW with adequate stability. No AD used for 400 ft ambulation Mod I. Pt ambulating with decreased speed and HR reaching 107 bpm.  Stairs Stairs: Yes Stairs assistance: Modified independent (Device/Increase time) Stair Management: One rail Left;Alternating pattern Number of Stairs: 3 General stair comments: Pt ascended and descended 3 stairs with L hand rail mod  I  Wheelchair Mobility    Modified Rankin (Stroke Patients Only)       Balance Overall balance assessment: Modified Independent                                           Pertinent Vitals/Pain Pain Assessment: 0-10 Pain Score: 5  Pain Location: right chest Pain Descriptors / Indicators: Sore Pain Intervention(s): Limited activity within patient's tolerance;Repositioned    Home Living Family/patient expects to be discharged to:: Private residence Living Arrangements: Spouse/significant other Available Help at Discharge: Family;Available 24 hours/day Type of Home: House Home Access: Stairs to enter   [employer] of Steps: 3 Home Layout: One level Home Equipment: None      Prior Function Level of Independence: Independent               Hand Dominance        Extremity/Trunk Assessment   Upper Extremity Assessment Upper Extremity Assessment: Overall WFL for tasks assessed    Lower Extremity Assessment Lower Extremity Assessment: Overall WFL for tasks assessed    Cervical / Trunk Assessment Cervical / Trunk Assessment: Normal  Communication   Communication: No difficulties  Cognition Arousal/Alertness: Awake/alert Behavior During Therapy: WFL for tasks assessed/performed Overall Cognitive Status: Within Functional Limits for tasks assessed  General Comments General comments (skin integrity, edema, etc.): Pt able to perform rhomberg and tandem stance without LOB    Exercises     Assessment/Plan    PT Assessment Patent does not need any further PT services  PT Problem List         PT Treatment Interventions      PT Goals (Current goals can be found in the Care Plan section)  Acute Rehab PT Goals Patient Stated Goal: To go home PT Goal Formulation: With patient Time For Goal Achievement: 07/01/17 Potential to Achieve Goals: Good    Frequency     Barriers  to discharge   None    Co-evaluation               AM-PAC PT "6 Clicks" Daily Activity  Outcome Measure Difficulty turning over in bed (including adjusting bedclothes, sheets and blankets)?: None Difficulty moving from lying on back to sitting on the side of the bed? : A Little Difficulty sitting down on and standing up from a chair with arms (e.g., wheelchair, bedside commode, etc,.)?: None Help needed moving to and from a bed to chair (including a wheelchair)?: None Help needed walking in hospital room?: None Help needed climbing 3-5 steps with a railing? : None 6 Click Score: 23    End of Session Equipment Utilized During Treatment: Gait belt Activity Tolerance: Patient tolerated treatment well Patient left: in chair;with call bell/phone within reach Nurse Communication: Mobility status PT Visit Diagnosis: Other abnormalities of gait and mobility (R26.89)    Time: 1347-1410 PT Time Calculation (min) (ACUTE ONLY): 23 min   Charges:   PT Evaluation $PT Eval Low Complexity: 1 Low     PT G Codes:        Andrew Mueller, SPT  Andrew Mueller 06/17/2017, 2:34 PM

## 2017-06-17 NOTE — Progress Notes (Signed)
17.335mcg of morphine wasted from PCA pump. Messan, RN as witness.   [place]  Idolina Mantell, RN

## 2017-06-17 NOTE — Progress Notes (Signed)
R chest tube removed; port CXR ordered; pt educated to remain flat for next 30 mins-1hr.   [place]  Cannen Dupras, RN

## 2017-06-17 NOTE — Care Management Note (Addendum)
Case Management Note  Patient Details  Name: Lemar LivingsKevin L Easler MRN: [id number] Date of Birth: 12/16/1980  Subjective/Objective:        Pt admitted with CP            Action/Plan:   PTA independent from home with wife. Pt confirmed he doesn't have insurance nor PCP. Pt has appt [facility].   Pt may need MATCH at discharge  Expected Discharge Date:  06/18/17               Expected Discharge Plan:  Home/Self Care(independent from home)  In-House Referral:     Discharge planning Services  CM Consult  Post Acute Care Choice:    Choice offered to:     DME Arranged:    DME Agency:     HH Arranged:    HH Agency:     Status of Service:  In process, will continue to follow  If discussed at Long Length of Stay Meetings, dates discussed:    Additional Comments:  Cherylann ParrClaxton, Gayatri Teasdale S, RN 06/17/2017, 2:25 PM

## 2017-06-17 NOTE — Progress Notes (Addendum)
      [address]       [place] [id number]             [phone]      4 Days Post-Op Procedure(s) (LRB): VIDEO ASSISTED THORACOSCOPY (VATS)/DRAIN EMPYEMA (Right) THORACOTOMY MAJOR (Right)   Subjective:  No new complaints.  Continues to have pain at his chest tube site.  + ambulation  + BM  Objective: Vital signs in last 24 hours: Temp:  [97.9 F (36.6 C)-99.4 F (37.4 C)] 98.5 F (36.9 C) (06/04 0401) Pulse Rate:  [78-93] 81 (06/04 0401) Cardiac Rhythm: Normal sinus rhythm (06/04 0735) Resp:  [15-21] 15 (06/04 0401) BP: (109-130)/(64-74) 109/64 (06/04 0401) SpO2:  [92 %-100 %] 96 % (06/04 0401)  Intake/Output from previous day: 06/03 0701 - 06/04 0700 In: 2001 [P.O.:600; I.V.:1; IV Piggyback:1400] Out: 465 [Urine:450; Chest Tube:15]  General appearance: alert, cooperative and no distress Heart: regular rate and rhythm Lungs: diminished breath sounds right base Abdomen: soft, non-tender; bowel sounds normal; no masses,  no organomegaly Extremities: extremities normal, atraumatic, no cyanosis or edema Wound: clean and dry, staples remain in place  Lab Results: Recent Labs    06/15/17 0655 06/17/17 0301  WBC 7.7 6.0  HGB 10.8* 10.3*  HCT 32.9* 31.7*  PLT 543* 512*   BMET:  Recent Labs    06/15/17 0655 06/17/17 0301  NA 134* 135  K 3.5 3.4*  CL 97* 101  CO2 27 27  GLUCOSE 134* 184*  BUN 7 9  CREATININE 0.64 0.73  CALCIUM 7.7* 7.9*    PT/INR: No results for input(s): LABPROT, INR in the last 72 hours. ABG    Component Value Date/Time   PHART 7.434 06/14/2017 0431   HCO3 26.7 06/14/2017 0431   TCO2 28 06/14/2017 0431   O2SAT 97.0 06/14/2017 0431   CBG (last 3)  Recent Labs    06/14/17 1200  GLUCAP 142*    Assessment/Plan: S/P Procedure(s) (LRB): VIDEO ASSISTED THORACOSCOPY (VATS)/DRAIN EMPYEMA (Right) THORACOTOMY MAJOR (Right)  1. Chest tube- output remains low, no air leak present- will d/c chest tube today, repeat  CXR in AM 2. ID- low grade temp at times, leukocytosis resolved- continue IV ABX until culture report is finalized.. Showing GPC in pairs, regrowth for anaerobes under way 3. CV- hemodynamically stable 4. D/C PCA 5. Dispo- patient stable, will d/c chest tube today, care per primary   LOS: 6 days    Andrew Mueller 06/17/2017   Chart reviewed, patient examined, agree with above. Chest tube is out. Pain better. CXR shows persistent patchy consolidation of the RLL but I think aeration is improving. Continue antibiotics. He can go home when medical team feels appropriate on oral antibiotics. We will need to arrange follow up for suture and staple removal and followup exam.

## 2017-06-17 NOTE — Progress Notes (Signed)
[place] TEAM 1 - Stepdown/ICU TEAM  Andrew Mueller  UJW:[id number] DOB: [birth year] DOA: 06/11/2017 PCP: Patient, No Pcp Per    Brief Narrative:  37yo M w/ a Hx of Tobacco abuse who presented with 3-week history of right-sided chest pain.  He was initially seen in the ED 1 1/2 weeks prior to this admit with a diagnosis of pleurisy, tx with analgesics, steroids, azithromycin. He chose not to take the abx, and his sx only worsened w/ use of steroids.    Significant Events: 5/29 admit - R empyema  5/31 VATS w/ decortication   Subjective: The patient's last chest tube was removed this morning.  He reports significant improvement in his pain as a result.  He denies shortness of breath nausea vomiting or abdominal pain.  He has not yet been up ambulating to a significant extent.    Assessment & Plan:  RLL Pneumonia w/ Empyema S/p VATS - post-op care as per TCTS w/ last chest tube now out - narrow abx as culture allows but no new definitive information available thus far today   Possible COPD not officially diagnosed with COPD but heavy smoker/vape user - would benefit from outpt PFTs once fully recovered - no wheezing currently   Tobacco abuse/vape abuse Has been counseled at length on need for total cessation  Hyperglycemia  A1c 6.3 therefore does not yet meet criteria for true DM - counsel pt on balanced diet and need for significant weight loss   Obesity - Body mass index is 36.24 kg/m.   DVT prophylaxis: lovenox  Code Status: FULL CODE Family Communication: no family present at time of exam  Disposition Plan: transfer to med/surg bed - ambulate - f/u culture data to allow transition to oral abx    Consultants:  TCTS  Antimicrobials:  Unasyn 5/30 > Vanc 5/31 > Zosyn 5/29   Objective: Blood pressure 129/78, pulse 91, temperature 97.9 F (36.6 C), temperature source Oral, resp. rate (!) 21, height 6' (1.829 m), weight 121.2 kg (267 lb 3.2 oz), SpO2 96  %.  Intake/Output Summary (Last 24 hours) at 06/17/2017 1201 Last data filed at 06/17/2017 1139 Gross per 24 hour  Intake 1880 ml  Output 2045 ml  Net -165 ml   Filed Weights   06/11/17 2100 06/12/17 2253 06/15/17 2052  Weight: 115.1 kg (253 lb 12 oz) 115.6 kg (254 lb 13.6 oz) 121.2 kg (267 lb 3.2 oz)    Examination: General: No acute respiratory distress at rest in bed  Lungs: mild crackles R base w/o change - no wheezing - good air movement on L  Cardiovascular: RRR - no M or rub  Abdomen: Obese, soft, BS+, no mass, no rebound Extremities: no signif C/C/E B LE   CBC: Recent Labs  Lab 06/11/17 1221  06/14/17 0234 06/15/17 0655 06/17/17 0301  WBC 13.2*  --  9.2 7.7 6.0  NEUTROABS 10.1*  --   --   --   --   HGB 13.1   < > 10.4* 10.8* 10.3*  HCT 38.4*   < > 31.6* 32.9* 31.7*  MCV 96.2  --  96.6 95.1 98.1  PLT 382  --  397 543* 512*   < > = values in this interval not displayed.   Basic Metabolic Panel: Recent Labs  Lab 06/14/17 0234 06/15/17 0655 06/17/17 0301  NA 129* 134* 135  K 3.9 3.5 3.4*  CL 95* 97* 101  CO2 26 27 27   GLUCOSE 163* 134* 184*  BUN 8 7 9   CREATININE 0.71 0.64 0.73  CALCIUM 7.5* 7.7* 7.9*   GFR: Estimated Creatinine Clearance: 168.2 mL/min (by C-G formula based on SCr of 0.73 mg/dL).  Liver Function Tests: Recent Labs  Lab 06/15/17 0655 06/17/17 0301  AST 28 26  ALT 26 25  ALKPHOS 49 46  BILITOT 0.2* 0.3  PROT 6.5 6.9  ALBUMIN 2.1* 2.0*   CBG: Recent Labs  Lab 06/13/17 2021 06/14/17 0021 06/14/17 0433 06/14/17 0805 06/14/17 1200  GLUCAP 141* 185* 146* 184* 142*    Recent Results (from the past 240 hour(s))  Culture, blood (routine x 2)     Status: None   Collection Time: 06/11/17  7:15 PM  Result Value Ref Range Status   Specimen Description   Final    BLOOD LEFT FOREARM Performed at [facility], [address] [place], [place] [address]    Special Requests   Final    BOTTLES DRAWN AEROBIC ONLY  Blood Culture results may not be optimal due to an inadequate volume of blood received in culture bottles Performed at [facility], [address] [place], [place] [address]    Culture   Final    NO GROWTH 5 DAYS Performed at [facility], [address] [place], [place] [address]    Report Status 06/16/2017 FINAL  Final  Culture, blood (routine x 2)     Status: None   Collection Time: 06/11/17  7:16 PM  Result Value Ref Range Status   Specimen Description   Final    BLOOD RIGHT FOREARM Performed at [facility], [address] [place], [place] [address]    Special Requests   Final    BOTTLES DRAWN AEROBIC AND ANAEROBIC Blood Culture adequate volume Performed at [facility], [address] [place], [place] [address]    Culture   Final    NO GROWTH 5 DAYS Performed at [facility], [address] [place], [place] [address]    Report Status 06/16/2017 FINAL  Final  MRSA PCR Screening     Status: None   Collection Time: 06/12/17  4:34 AM  Result Value Ref Range Status   MRSA by PCR NEGATIVE NEGATIVE Final    Comment:        The GeneXpert MRSA Assay (FDA approved for NASAL specimens only), is one component of a comprehensive MRSA colonization surveillance program. It is not intended to diagnose MRSA infection nor to guide or monitor treatment for MRSA infections. Performed at [facility], [address] [place], [place] [address]   Culture, expectorated sputum-assessment     Status: None   Collection Time: 06/12/17  9:45 AM  Result Value Ref Range Status   Specimen Description SPUTUM  Final   Special Requests NONE  Final   Sputum evaluation   Final    THIS SPECIMEN IS ACCEPTABLE FOR SPUTUM CULTURE Performed at [facility], [address] [place], [place] [address]    Report Status 06/12/2017 FINAL  Final  Culture, respiratory (NON-Expectorated)      Status: None   Collection Time: 06/12/17  9:45 AM  Result Value Ref Range Status   Specimen Description   Final    SPUTUM Performed at [facility], [address] [place], [place] [address]    Special Requests   Final    NONE Reflexed from [id number] Performed at [facility], [address] [place], [place] [address]    Gram Stain   Final    ABUNDANT WBC PRESENT, PREDOMINANTLY PMN FEW GRAM POSITIVE COCCI FEW GRAM VARIABLE ROD RARE YEAST Performed at [facility], [address] [place], [place] [address]    Culture FEW CANDIDA TROPICALIS  Final   Report Status 06/15/2017 FINAL  Final  Aerobic/Anaerobic Culture (surgical/deep wound)     Status: None (Preliminary result)   Collection Time: 06/13/17 11:08 AM  Result Value Ref Range Status   Specimen Description PLEURAL RIGHT  Final   Special Requests NONE  Final   Gram Stain   Final    ABUNDANT WBC PRESENT, PREDOMINANTLY PMN ABUNDANT GRAM POSITIVE COCCI IN PAIRS FEW GRAM NEGATIVE RODS    Culture   Final    HOLDING FOR POSSIBLE ANAEROBE Performed at [facility], [address] [place], [place] [address]    Report Status PENDING  Incomplete  Acid Fast Smear (AFB)     Status: None   Collection Time: 06/13/17 11:08 AM  Result Value Ref Range Status   AFB Specimen Processing Concentration  Final   Acid Fast Smear Negative  Final    Comment: (NOTE) Performed At: [facility] [address] [place], [place] [id number] Jolene Schimke MD OZ:[id number]    Source (AFB) PLEURAL  Final    Comment: Performed at [facility], [address], [place], [place] [address]     Scheduled Meds: . acetaminophen  1,000 mg Oral Q6H   Or  . acetaminophen (TYLENOL) oral liquid 160 mg/5 mL  1,000 mg Oral Q6H  . bisacodyl  10 mg Oral Daily  . enoxaparin (LOVENOX) injection  40 mg Subcutaneous Daily  . nicotine  7 mg Transdermal Daily  . senna-docusate  1 tablet Oral QHS      LOS: 6 days   Lonia Blood, MD [facility] Office  [phone] Pager - Text Page per Amion as per below:  On-Call/Text Page:      [email or web address]      password [email or web address]  If 7PM-7AM, please contact night-coverage [email or web address] Password [email or web address] 06/17/2017, 12:01 PM

## 2017-06-18 ENCOUNTER — Inpatient Hospital Stay (HOSPITAL_COMMUNITY): Payer: Self-pay

## 2017-06-18 LAB — BASIC METABOLIC PANEL
ANION GAP: 7 (ref 5–15)
BUN: 6 mg/dL (ref 6–20)
CHLORIDE: 100 mmol/L — AB (ref 101–111)
CO2: 30 mmol/L (ref 22–32)
Calcium: 8.2 mg/dL — ABNORMAL LOW (ref 8.9–10.3)
Creatinine, Ser: 0.77 mg/dL (ref 0.61–1.24)
GFR calc Af Amer: >60 mL/min (ref >60)
GFR calc non Af Amer: >60 mL/min (ref >60)
GLUCOSE: 140 mg/dL — AB (ref 65–99)
Potassium: 3.7 mmol/L (ref 3.5–5.1)
Sodium: 137 mmol/L (ref 135–145)

## 2017-06-18 LAB — AEROBIC/ANAEROBIC CULTURE W GRAM STAIN (SURGICAL/DEEP WOUND)

## 2017-06-18 LAB — AEROBIC/ANAEROBIC CULTURE (SURGICAL/DEEP WOUND)

## 2017-06-18 LAB — VANCOMYCIN, TROUGH: Vancomycin Tr: 14 ug/mL — ABNORMAL LOW (ref 15–20)

## 2017-06-18 LAB — MAGNESIUM: MAGNESIUM: 2 mg/dL (ref 1.7–2.4)

## 2017-06-18 MED ORDER — AMOXICILLIN-POT CLAVULANATE 875-125 MG PO TABS: 1.0000 | ORAL_TABLET | Freq: Two times a day (BID) | ORAL | 0 refills | Status: DC

## 2017-06-18 MED ORDER — SACCHAROMYCES BOULARDII 250 MG PO CAPS
250.0000 mg | ORAL_CAPSULE | Freq: Two times a day (BID) | ORAL | Status: DC
  Administered 2017-06-18: 250 mg via ORAL
  Filled 2017-06-18: qty 1

## 2017-06-18 MED ORDER — AMOXICILLIN-POT CLAVULANATE 875-125 MG PO TABS: 1.0000 | ORAL_TABLET | Freq: Two times a day (BID) | ORAL | Status: DC

## 2017-06-18 MED ORDER — SACCHAROMYCES BOULARDII 250 MG PO CAPS: 250.0000 mg | ORAL_CAPSULE | Freq: Two times a day (BID) | ORAL | 0 refills | Status: DC

## 2017-06-18 MED ORDER — OXYCODONE HCL 5 MG PO TABS: 5.0000 mg | ORAL_TABLET | ORAL | 0 refills | Status: DC | PRN

## 2017-06-18 MED ORDER — NICOTINE 7 MG/24HR TD PT24: 7.0000 mg | MEDICATED_PATCH | Freq: Every day | TRANSDERMAL | 0 refills | Status: DC

## 2017-06-18 NOTE — Discharge Summary (Signed)
Physician Discharge Summary  Andrew Mueller UYQ:[id number] DOB: 08/21/1980 DOA: 06/11/2017  PCP: Patient, No Pcp Per  Admit date: 06/11/2017 Discharge date: 06/18/2017  Admitted From: Home  Disposition: Home   Recommendations for Outpatient Follow-up:  1. Follow up with PCP in 1-2 weeks 2. Please obtain BMP/CBC in one week 3. Needs to follow up with CVTS for further care after VAST 4. Needs to follow up with dentist   Home Health: no  Discharge Condition: Stable.  CODE STATUS: Full code.  Diet recommendation: Heart Healthy  Brief/Interim Summary: 37yo M w/ a Hx ofTobacco abuse who presented with 3-week history of right-sided chest pain.  He was initially seen in the ED 1 1/2 weeks prior to this admit with a diagnosis of pleurisy, tx with analgesics, steroids, azithromycin. He chose not to take the abx, and his sx only worsened w/ use of steroids.      RLL Pneumonia w/ Empyema S/p VATS - post-op care as per TCTS w/ last chest tube removed -  Culture grew Moderate Parvimonas, and moderate fusobacterium.  Discussed with Dr Luciana Axe with ID, ok to use Augmentin, treat for 3 to 4 weeks. I have prescribe 3 weeks of antibiotics. Patient will need to follow up with CVTS,outpatient. Will need follow up x ray.  Needs referral to dentist, due to culture results.  Defer pain medication to CVTS  Possible COPD not officially diagnosed with COPD but heavy smoker/vape user - would benefit from outpt PFTs once fully recovered - no wheezing currently   Tobacco abuse/vape abuse Has been counseled at length on need for total cessation  Hyperglycemia  A1c 6.3 therefore does not yet meet criteria for true DM - counsel pt on balanced diet and need for significant weight loss   Obesity - Body mass index is 36.24 kg/m.     Discharge Diagnoses:  Active Problems:   Right lower lobe pneumonia (HCC)   Empyema (HCC)    Discharge Instructions   Allergies as of 06/18/2017   No Known  Allergies     Medication List    STOP taking these medications   azithromycin 250 MG tablet Commonly known as:  ZITHROMAX Z-PAK   guaiFENesin-codeine 100-10 MG/5ML syrup   naproxen 500 MG tablet Commonly known as:  NAPROSYN   oxyCODONE-acetaminophen 5-325 MG tablet Commonly known as:  PERCOCET   predniSONE 10 MG (21) Tbpk tablet Commonly known as:  STERAPRED UNI-PAK 21 TAB     TAKE these medications   amoxicillin-clavulanate 875-125 MG tablet Commonly known as:  AUGMENTIN Take 1 tablet by mouth every 12 (twelve) hours for 21 days.   HYDROcodone-acetaminophen 5-325 MG tablet Commonly known as:  NORCO/VICODIN Take 1 tablet by mouth every 6 (six) hours as needed.   meloxicam 15 MG tablet Commonly known as:  MOBIC Take 1 tablet (15 mg total) by mouth daily.   nicotine 7 mg/24hr patch Commonly known as:  NICODERM CQ - dosed in mg/24 hr Place 1 patch (7 mg total) onto the skin daily. Start taking on:  06/19/2017   saccharomyces boulardii 250 MG capsule Commonly known as:  FLORASTOR Take 1 capsule (250 mg total) by mouth 2 (two) times daily.      Follow-up Information    [facility] Follow up on 07/10/2017.   Why:  10:10 for hospital follow up apt Contact information: [place] [address] [phone]       [facility] Follow  up.   Why:  you can use the pharmacy for medication assitance. Contact information: [address] [place] [address] [phone]       [facility] Follow up on 06/27/2017.   Specialty:  Cardiothoracic Surgery Why:  For staple and chest tube suture removal.. Appointment is at  Contact information: [address] [place], [address] [place] [address] [phone]       Alleen Borne, MD Follow up.   Specialty:  Cardiothoracic Surgery Contact  information: [address] [place] [address] [phone]          No Known Allergies  Consultations:  CVTS   Procedures/Studies: Dg Chest 2 View  Result Date: 06/11/2017 CLINICAL DATA:  Right lower chest pain, shortness of breath, cough EXAM: CHEST - 2 VIEW COMPARISON:  05/31/2017 FINDINGS: There is airspace consolidation noted in the inferior right upper lobe and right lower lobe, worsening since prior study. Air-fluid level is noted peripherally in the right lower hemithorax which could reflect abscess or bronchopleural fistula. Left lung clear. Heart is normal size. No acute bony abnormality. IMPRESSION: Worsening airspace consolidation in the right lower lobe and inferior right upper lobe concerning for pneumonia. Short air-fluid level noted peripherally at the right lung base could be parenchymal or pleural and represent abscess or bronchopleural fistula. Recommend further evaluation with chest CT with IV contrast. Electronically Signed   By: Charlett Nose M.D.   On: 06/11/2017 09:09   Dg Ribs Unilateral W/chest Right  Result Date: 05/31/2017 CLINICAL DATA:  Subacute onset of right rib pain. Initial encounter. EXAM: RIGHT RIBS AND CHEST - 3+ VIEW COMPARISON:  Chest radiograph performed 11/26/2014 FINDINGS: No displaced rib fractures are seen. The lungs are well-aerated. A small right pleural effusion is noted. There is no evidence of pneumothorax. The cardiomediastinal silhouette is within normal limits. No acute osseous abnormalities are seen. IMPRESSION: No displaced rib fracture seen. Small right pleural effusion noted, new from the prior study. Depending on the patient's symptoms and history, CTA of the chest might be considered to help exclude pulmonary embolus. Electronically Signed   By: Roanna Raider M.D.   On: 05/31/2017 06:20   Ct Chest W Contrast  Result Date: 06/11/2017 CLINICAL DATA:  Shortness of breath, right lateral chest pain for 1 month, and  fever. EXAM: CT CHEST WITH CONTRAST TECHNIQUE: Multidetector CT imaging of the chest was performed during intravenous contrast administration. CONTRAST:  75mL ISOVUE-300 IOPAMIDOL (ISOVUE-300) INJECTION 61% COMPARISON:  05/31/2017. FINDINGS: Cardiovascular: Vascular structures are unremarkable. Heart size normal. No pericardial effusion. Mediastinum/Nodes: Mediastinal lymph nodes measure up to 1.6 cm in the low right paratracheal station, previously 9 mm. Subcarinal lymph node measures 1.7 cm, previously 12 mm. Right hilar adenopathy measures up to 1.6 cm, previously 11 mm. No axillary adenopathy. Esophagus is grossly unremarkable. Lungs/Pleura: Septal thickening, peribronchovascular nodularity/consolidation and volume loss in the right middle and right lower lobes are largely new from 05/31/2017. In addition, there are sizable loculated collections of pleural fluid and air in the right hemithorax, also largely new from 05/31/2017. Probable subpleural lymph nodes along the minor fissure. Left lung is clear. Debris is seen dependently in the trachea. Upper Abdomen: Liver may be slightly decreased in attenuation diffusely. Visualized portions of the liver, gallbladder, adrenal glands, kidneys, spleen, pancreas stomach and bowel are otherwise grossly unremarkable. There are several upper abdominal lymph nodes which measure up to 10 mm in the gastrohepatic ligament. Musculoskeletal: Degenerative  changes in the spine. IMPRESSION: 1. Septal thickening, peribronchovascular nodularity/consolidation and loculated collections of pleural fluid/air in the right hemithorax, largely new from 05/31/2017 and most indicative of bronchopneumonia with empyema. Difficult to definitively exclude a bronchopleural fistula. 2. New mediastinal/subcarinal and right hilar adenopathy, likely reactive. 3. Liver may be steatotic. Electronically Signed   By: Leanna Battles M.D.   On: 06/11/2017 14:51   Ct Angio Chest Pe W And/or Wo  Contrast  Result Date: 05/31/2017 CLINICAL DATA:  Sharp right upper and lower pain, back pain EXAM: CT ANGIOGRAPHY CHEST WITH CONTRAST TECHNIQUE: Multidetector CT imaging of the chest was performed using the standard protocol during bolus administration of intravenous contrast. Multiplanar CT image reconstructions and MIPs were obtained to evaluate the vascular anatomy. CONTRAST:  ISOVUE-370 IOPAMIDOL (ISOVUE-370) INJECTION 76% COMPARISON:  None. FINDINGS: Cardiovascular: Normal heart size. No pericardial effusion. Satisfactory opacification of pulmonary arteries noted, and there is no evidence of pulmonary emboli. Breathing motion degrades some of the images. Adequate contrast opacification of the thoracic aorta with no evidence of dissection, aneurysm, or stenosis. There is classic 3-vessel brachiocephalic arch anatomy without proximal stenosis. No significant atheromatous plaque. Mediastinum/Nodes: No enlarged mediastinal, hilar, or axillary lymph nodes. Thyroid gland, trachea, and esophagus demonstrate no significant findings. Lungs/Pleura: Tiny right pleural effusion. Adjacent atelectasis/consolidation in the posteromedial right lower lobe, and posterolateral right middle lobe. Left lung clear. Upper Abdomen: No acute abnormality. Musculoskeletal: Anterior vertebral endplate spurring at multiple levels in the lower thoracic spine. Review of the MIP images confirms the above findings. IMPRESSION: 1. Negative for acute PE or thoracic aortic dissection. 2. Small right pleural effusion with subsegmental consolidation/atelectasis in the right middle and lower lobes. Electronically Signed   By: Corlis Leak M.D.   On: 05/31/2017 14:49   Dg Chest Port 1 View  Result Date: 06/17/2017 CLINICAL DATA:  Right-sided chest tube removal. Recent VATS procedure EXAM: PORTABLE CHEST 1 VIEW COMPARISON:  June 16, 2017 FINDINGS: Chest tube has been removed from the right side without pneumothorax evident. There remains  patchy consolidation and pleural effusion on the right. There is stable atelectatic change in the left mid lung. No new opacity. Heart size and pulmonary vascularity are normal. No adenopathy. No bone lesions. IMPRESSION: No evident pneumothorax on the right after chest tube removal. Persistent right effusion with patchy consolidation right lower lung region. Stable left midlung atelectasis. No new opacity. Stable cardiac silhouette. Electronically Signed   By: Bretta Bang III M.D.   On: 06/17/2017 10:55   Dg Chest Port 1 View  Result Date: 06/16/2017 CLINICAL DATA:  Empyema EXAM: PORTABLE CHEST 1 VIEW COMPARISON:  Yesterday FINDINGS: 1 of 2 right base chest tubes remain. Unchanged low volumes with pleuroparenchymal opacity at the right base. Normal heart size. No pneumothorax. IMPRESSION: Stable low volumes and pleuroparenchymal opacity at the right base. No pneumothorax. Electronically Signed   By: Marnee Spring M.D.   On: 06/16/2017 07:20   Dg Chest Port 1 View  Result Date: 06/15/2017 CLINICAL DATA:  Empyema EXAM: PORTABLE CHEST 1 VIEW COMPARISON:  June 14, 2017 FINDINGS: No change in the 2 right-sided chest tubes, pleural fluid, or right basilar opacity in the interval. Atelectasis or scar remains in the left mid lung. No change in the cardiomediastinal silhouette. No pneumothorax. No other change. IMPRESSION: 1. Stable right chest tubes, right pleural fluid, and right basilar opacity. No interval change. Electronically Signed   By: Gerome Sam III M.D   On: 06/15/2017 07:15  Dg Chest Port 1 View  Result Date: 06/14/2017 CLINICAL DATA:  Followup.  Status post right thoracotomy. EXAM: PORTABLE CHEST 1 VIEW COMPARISON:  06/13/2017 and older exams. FINDINGS: Stable right lower hemithorax chest tubes. There is stable persistent right lung base opacity. Right upper lung is clear. No pneumothorax. Linear atelectasis extends laterally from the left hilum, stable. Left lung otherwise clear. The  previously described malpositioned right internal jugular central venous line has been removed. Cardiac silhouette is normal size. IMPRESSION: 1. No change in right inferior hemithorax chest tubes or right lung base opacity when compared the prior exam. 2. No new lung abnormalities. 3. No pneumothorax. Electronically Signed   By: Amie Portland M.D.   On: 06/14/2017 08:10   Dg Chest Port 1 View  Result Date: 06/13/2017 CLINICAL DATA:  Status post right thoracotomy. EXAM: PORTABLE CHEST 1 VIEW COMPARISON:  Chest radiographs and chest CT obtained yesterday. FINDINGS: Normal sized heart. Chest tubes overlying the right lower chest and upper abdomen with decreased right pleural fluid. There is some persistent patchy and ill-defined density in the right lower lung zone. Interval small amount of linear density in the left mid lung zone. Interval right jugular catheter with its tip overlying the upper right axilla. Decreased inspiration. Grossly normal sized heart. Unremarkable bones. IMPRESSION: 1. Malpositioned right jugular catheter with its tip in the right axillary vein. 2. Right basilar chest tubes with decreased pleural fluid and no pneumothorax. 3. Residual probable pneumonia at the right lung base. 4. Interval linear atelectasis on the left. These results will be called to the ordering clinician or representative by the Radiologist Assistant, and communication documented in the PACS or zVision Dashboard. Electronically Signed   By: Beckie Salts M.D.   On: 06/13/2017 12:41       Subjective: He is feeling ok, breathing bette.   Discharge Exam: Vitals:   06/18/17 0300 06/18/17 0830  BP: 114/66 (!) 138/92  Pulse: 95   Resp: 18   Temp: 98.9 F (37.2 C) 98.8 F (37.1 C)  SpO2: 96%    Vitals:   06/17/17 1900 06/17/17 2300 06/18/17 0300 06/18/17 0830  BP: 132/89 130/69 114/66 (!) 138/92  Pulse: 93 98 95   Resp: 18 18 18    Temp: 98.6 F (37 C) 99.1 F (37.3 C) 98.9 F (37.2 C) 98.8 F (37.1  C)  TempSrc: Oral Oral Oral Oral  SpO2: 100% 99% 96%   Weight:   120.3 kg (265 lb 3.4 oz)   Height:   6' (1.829 m)     General: Pt is alert, awake, not in acute distress Cardiovascular: RRR, S1/S2 +, no rubs, no gallops Respiratory: CTA bilaterally, no wheezing, no rhonchi Abdominal: Soft, NT, ND, bowel sounds + Extremities: no edema, no cyanosis    The results of significant diagnostics from this hospitalization (including imaging, microbiology, ancillary and laboratory) are listed below for reference.     Microbiology: Recent Results (from the past 240 hour(s))  Culture, blood (routine x 2)     Status: None   Collection Time: 06/11/17  7:15 PM  Result Value Ref Range Status   Specimen Description   Final    BLOOD LEFT FOREARM Performed at [facility], [address] [place], [place] [address]    Special Requests   Final    BOTTLES DRAWN AEROBIC ONLY Blood Culture results may not be optimal due to an inadequate volume of blood received in culture bottles Performed at [facility], [address] [place], [place] [address]    Culture   Final    NO GROWTH 5 DAYS Performed at [facility], [address] [place], [place] [address]    Report Status 06/16/2017 FINAL  Final  Culture, blood (routine x 2)     Status: None   Collection Time: 06/11/17  7:16 PM  Result Value Ref Range Status   Specimen Description   Final    BLOOD RIGHT FOREARM Performed at [facility], [address] [place], [place] [address]    Special Requests   Final    BOTTLES DRAWN AEROBIC AND ANAEROBIC Blood Culture adequate volume Performed at [facility], [address], [place], [place] [address]    Culture   Final    NO GROWTH 5 DAYS Performed at [facility], [address], [place], [place] [address]    Report Status 06/16/2017 FINAL  Final  MRSA PCR Screening     Status: None   Collection Time:  06/12/17  4:34 AM  Result Value Ref Range Status   MRSA by PCR NEGATIVE NEGATIVE Final    Comment:        The GeneXpert MRSA Assay (FDA approved for NASAL specimens only), is one component of a comprehensive MRSA colonization surveillance program. It is not intended to diagnose MRSA infection nor to guide or monitor treatment for MRSA infections. Performed at [facility], [address], [place], [place] [address]   Culture, expectorated sputum-assessment     Status: None   Collection Time: 06/12/17  9:45 AM  Result Value Ref Range Status   Specimen Description SPUTUM  Final   Special Requests NONE  Final   Sputum evaluation   Final    THIS SPECIMEN IS ACCEPTABLE FOR SPUTUM CULTURE Performed at [facility], [address] [place], [place] [address]    Report Status 06/12/2017 FINAL  Final  Culture, respiratory (NON-Expectorated)     Status: None   Collection Time: 06/12/17  9:45 AM  Result Value Ref Range Status   Specimen Description   Final    SPUTUM Performed at [facility], [address] [place], [place] [address]    Special Requests   Final    NONE Reflexed from [id number] Performed at [facility], [address] [place], [place] [address]    Gram Stain   Final    ABUNDANT WBC PRESENT, PREDOMINANTLY PMN FEW GRAM POSITIVE COCCI FEW GRAM VARIABLE ROD RARE YEAST Performed at [facility], [address], [place], [place] [address]    Culture FEW CANDIDA TROPICALIS  Final   Report Status 06/15/2017 FINAL  Final  Fungus Culture With Stain     Status: None (Preliminary result)   Collection Time: 06/13/17 11:08 AM  Result Value Ref Range Status   Fungus Stain Final report  Final    Comment: (NOTE) Performed At: [facility] [address] [place], [place] [id number] Jolene Schimke MD QV:[id number]    Fungus (Mycology) Culture PENDING  Incomplete   Fungal Source PLEURAL   Final    Comment: Performed at [facility], [address], [place], [place] [address]  Aerobic/Anaerobic Culture (surgical/deep wound)     Status: None (Preliminary result)   Collection Time: 06/13/17 11:08 AM  Result Value Ref Range Status   Specimen Description PLEURAL RIGHT  Final   Special Requests NONE  Final   Gram Stain  Final    ABUNDANT WBC PRESENT, PREDOMINANTLY PMN ABUNDANT GRAM POSITIVE COCCI IN PAIRS FEW GRAM NEGATIVE RODS    Culture   Final    MODERATE PARVIMONAS MICRA MODERATE FUSOBACTERIUM NUCLEATUM BETA LACTAMASE NEGATIVE Performed at [facility], [address] [place], [place] [address]    Report Status PENDING  Incomplete  Acid Fast Smear (AFB)     Status: None   Collection Time: 06/13/17 11:08 AM  Result Value Ref Range Status   AFB Specimen Processing Concentration  Final   Acid Fast Smear Negative  Final    Comment: (NOTE) Performed At: [facility] [address] [place], [place] [id number] Jolene SchimkeNagendra Sanjai MD XB:[id number]:[phone]    Source (AFB) PLEURAL  Final    Comment: Performed at [facility], [address] [place], [place] [address]  Fungus Culture Result     Status: None   Collection Time: 06/13/17 11:08 AM  Result Value Ref Range Status   Result 1 Comment  Final    Comment: (NOTE) KOH/Calcofluor preparation:  no fungus observed. Performed At: [facility] [address] [place], [place] [id number] Jolene SchimkeNagendra Sanjai MD [id number]:[phone] Performed at [facility], [address] [place], [place] [address]      Labs: BNP (last 3 results) No results for input(s): BNP in the last 8760 hours. Basic Metabolic Panel: Recent Labs  Lab 06/11/17 1229 06/14/17 0234 06/15/17 0655 06/17/17 0301 06/18/17 0742  NA 133* 129* 134* 135 137  K 4.0 3.9 3.5 3.4* 3.7  CL 94* 95* 97* 101 100*  CO2  --  26 27 27 30   GLUCOSE 119* 163* 134* 184* 140*  BUN 12 8 7 9 6   CREATININE 0.80 0.71 0.64 0.73 0.77  CALCIUM  --   7.5* 7.7* 7.9* 8.2*  MG  --   --   --   --  2.0   Liver Function Tests: Recent Labs  Lab 06/15/17 0655 06/17/17 0301  AST 28 26  ALT 26 25  ALKPHOS 49 46  BILITOT 0.2* 0.3  PROT 6.5 6.9  ALBUMIN 2.1* 2.0*   No results for input(s): LIPASE, AMYLASE in the last 168 hours. No results for input(s): AMMONIA in the last 168 hours. CBC: Recent Labs  Lab 06/11/17 1221 06/11/17 1229 06/14/17 0234 06/15/17 0655 06/17/17 0301  WBC 13.2*  --  9.2 7.7 6.0  NEUTROABS 10.1*  --   --   --   --   HGB 13.1 13.9 10.4* 10.8* 10.3*  HCT 38.4* 41.0 31.6* 32.9* 31.7*  MCV 96.2  --  96.6 95.1 98.1  PLT 382  --  397 543* 512*   Cardiac Enzymes: No results for input(s): CKTOTAL, CKMB, CKMBINDEX, TROPONINI in the last 168 hours. BNP: Invalid input(s): POCBNP CBG: Recent Labs  Lab 06/13/17 2021 06/14/17 0021 06/14/17 0433 06/14/17 0805 06/14/17 1200  GLUCAP 141* 185* 146* 184* 142*   D-Dimer No results for input(s): DDIMER in the last 72 hours. Hgb A1c Recent Labs    06/17/17 0301  HGBA1C 6.3*   Lipid Profile No results for input(s): CHOL, HDL, LDLCALC, TRIG, CHOLHDL, LDLDIRECT in the last 72 hours. Thyroid function studies No results for input(s): TSH, T4TOTAL, T3FREE, THYROIDAB in the last 72 hours.  Invalid input(s): FREET3 Anemia work up No results for input(s): VITAMINB12, FOLATE, FERRITIN, TIBC, IRON, RETICCTPCT in the last 72 hours. Urinalysis No results found for: COLORURINE, APPEARANCEUR, LABSPEC, PHURINE, GLUCOSEU, HGBUR, BILIRUBINUR, KETONESUR, PROTEINUR, UROBILINOGEN, NITRITE, LEUKOCYTESUR Sepsis  Labs Invalid input(s): PROCALCITONIN,  WBC,  LACTICIDVEN Microbiology Recent Results (from the past 240 hour(s))  Culture, blood (routine x 2)     Status: None   Collection Time: 06/11/17  7:15 PM  Result Value Ref Range Status   Specimen Description   Final    BLOOD LEFT FOREARM Performed at [facility], [address], [place], [place]  [address]    Special Requests   Final    BOTTLES DRAWN AEROBIC ONLY Blood Culture results may not be optimal due to an inadequate volume of blood received in culture bottles Performed at [facility], [address], [place], [place] [address]    Culture   Final    NO GROWTH 5 DAYS Performed at [facility], [address], [place], [place] [address]    Report Status 06/16/2017 FINAL  Final  Culture, blood (routine x 2)     Status: None   Collection Time: 06/11/17  7:16 PM  Result Value Ref Range Status   Specimen Description   Final    BLOOD RIGHT FOREARM Performed at [facility], [address], [place], [place] [address]    Special Requests   Final    BOTTLES DRAWN AEROBIC AND ANAEROBIC Blood Culture adequate volume Performed at [facility], [address] [place], [place] [address]    Culture   Final    NO GROWTH 5 DAYS Performed at [facility], [address] [place], [place] [address]    Report Status 06/16/2017 FINAL  Final  MRSA PCR Screening     Status: None   Collection Time: 06/12/17  4:34 AM  Result Value Ref Range Status   MRSA by PCR NEGATIVE NEGATIVE Final    Comment:        The GeneXpert MRSA Assay (FDA approved for NASAL specimens only), is one component of a comprehensive MRSA colonization surveillance program. It is not intended to diagnose MRSA infection nor to guide or monitor treatment for MRSA infections. Performed at [facility], [address], [place], [place] [address]   Culture, expectorated sputum-assessment     Status: None   Collection Time: 06/12/17  9:45 AM  Result Value Ref Range Status   Specimen Description SPUTUM  Final   Special Requests NONE  Final   Sputum evaluation   Final    THIS SPECIMEN IS ACCEPTABLE FOR SPUTUM CULTURE Performed at [facility], [address], [place], [place] [address]    Report Status  06/12/2017 FINAL  Final  Culture, respiratory (NON-Expectorated)     Status: None   Collection Time: 06/12/17  9:45 AM  Result Value Ref Range Status   Specimen Description   Final    SPUTUM Performed at [facility], [address], [place], [place] [address]    Special Requests   Final    NONE Reflexed from [id number] Performed at [facility], [address] [address] [place], [place] [address]    Gram Stain   Final    ABUNDANT WBC PRESENT, PREDOMINANTLY PMN FEW GRAM POSITIVE COCCI FEW GRAM VARIABLE ROD RARE YEAST Performed at [facility], [address], [place], [place] [address]    Culture FEW CANDIDA TROPICALIS  Final   Report Status 06/15/2017 FINAL  Final  Fungus Culture With Stain     Status: None (Preliminary result)   Collection Time: 06/13/17 11:08 AM  Result Value Ref  Range Status   Fungus Stain Final report  Final    Comment: (NOTE) Performed At: [facility] [address] [place], [place] [id number] Jolene Schimke MD ZD:[id number]    Fungus (Mycology) Culture PENDING  Incomplete   Fungal Source PLEURAL  Final    Comment: Performed at [facility], [address], [place], [place] [address]  Aerobic/Anaerobic Culture (surgical/deep wound)     Status: None (Preliminary result)   Collection Time: 06/13/17 11:08 AM  Result Value Ref Range Status   Specimen Description PLEURAL RIGHT  Final   Special Requests NONE  Final   Gram Stain   Final    ABUNDANT WBC PRESENT, PREDOMINANTLY PMN ABUNDANT GRAM POSITIVE COCCI IN PAIRS FEW GRAM NEGATIVE RODS    Culture   Final    MODERATE PARVIMONAS MICRA MODERATE FUSOBACTERIUM NUCLEATUM BETA LACTAMASE NEGATIVE Performed at [facility], [address] [place], [place] [address]    Report Status PENDING  Incomplete  Acid Fast Smear (AFB)     Status: None   Collection Time: 06/13/17 11:08 AM  Result Value Ref Range Status   AFB Specimen Processing Concentration   Final   Acid Fast Smear Negative  Final    Comment: (NOTE) Performed At: [facility] [address] [place], [place] [id number] Jolene Schimke MD [id number]    Source (AFB) PLEURAL  Final    Comment: Performed at [facility], [address] [place], [place] [address]  Fungus Culture Result     Status: None   Collection Time: 06/13/17 11:08 AM  Result Value Ref Range Status   Result 1 Comment  Final    Comment: (NOTE) KOH/Calcofluor preparation:  no fungus observed. Performed At: [facility] [address] [place], [place] [id number] Jolene Schimke MD YH:[id number] Performed at [facility], [address] [place], [place] [address]      Time coordinating discharge: 35 minutes.   SIGNED:   Alba Cory, MD  [facility] 06/18/2017, 9:09 AM Pager [phone]  If 7PM-7AM, please contact night-coverage [email or web address] Password [email or web address]

## 2017-06-18 NOTE — Progress Notes (Signed)
Pharmacy Antibiotic Note  Andrew Mueller is a 37 y.o. male admitted on 06/11/2017 with pneumonia.  Pharmacy has been consulted for vancomycin dosing.  Pt s/p OR on 5/31 for VATS and empyema drain.  Afebrile overnight, wbc down to 6, renal function has remained normal with good uop documented.   Vancomycin trough this morning is right at goal at 14, given risk of accumulation on such high dosing will leave at current dose for now.   We continue to await sensitivities on pending cultures prior to discharge.   Plan: Vancomycin to 1500mg  IV every 8 hours.  No changes to unasyn Monitor clinical progression and LOT  Height: 6' (182.9 cm) Weight: 265 lb 3.4 oz (120.3 kg) IBW/kg (Calculated) : 77.6  Temp (24hrs), Avg:98.8 F (37.1 C), Min:98.3 F (36.8 C), Max:99.1 F (37.3 C)  Recent Labs  Lab 06/11/17 1221 06/11/17 1229 06/14/17 0234 06/15/17 0655 06/16/17 0629 06/17/17 0301 06/18/17 0742  WBC 13.2*  --  9.2 7.7  --  6.0  --   CREATININE  --  0.80 0.71 0.64  --  0.73 0.77  LATICACIDVEN  --  1.25  --   --   --   --   --   VANCOTROUGH  --   --   --   --  12*  --  14*    Estimated Creatinine Clearance: 167.7 mL/min (by C-G formula based on SCr of 0.77 mg/dL).    No Known Allergies  Thank you for allowing pharmacy to be a part of this patient's care.  Sheppard CoilFrank Wilson PharmD., BCPS Clinical Pharmacist 06/18/2017 9:09 AM

## 2017-06-18 NOTE — Plan of Care (Signed)

## 2017-06-18 NOTE — Progress Notes (Signed)
OT Screen  Patient Details Name: Andrew Mueller MRN: [id number] DOB: Jan 30, 1980   Cancelled Treatment:    Reason Eval/Treat Not Completed: OT screened, no needs identified, will sign off. RN and PT stating pt near baseline function. Discussed planned dc today with pt. Pt reporting no concerns about ADLs and mobility at home. All acute OT needs met and will screen. Thank you.  [place], OTR/L Acute Rehab Pager: [phone] Office: [phone] 06/18/2017, 11:09 AM

## 2017-06-18 NOTE — Care Management Note (Signed)
Case Management Note  Patient Details  Name: Andrew Mueller MRN: [id number] Date of Birth: 11/16/1980  Subjective/Objective:        Pt admitted with CP            Action/Plan:   PTA independent from home with wife. Pt confirmed he doesn't have insurance nor PCP. [facility].   Pt may need MATCH at discharge  Expected Discharge Date:  06/18/17               Expected Discharge Plan:  Home/Self Care(independent from home)  In-House Referral:     Discharge planning Services  CM Consult  Post Acute Care Choice:    Choice offered to:     DME Arranged:    DME Agency:     HH Arranged:    HH Agency:     Status of Service:  In process, will continue to follow  If discussed at Long Length of Stay Meetings, dates discussed:    Additional Comments: 06/18/2017 Pt to discharge home today. [facility] - pt denied barriers with paying coupon rate of 26.15 for Augmentin at pharmacy.  Pt declined MATCH.  CM provided coupon.   Cherylann ParrClaxton, Rosmary Dionisio S, RN 06/18/2017, 10:59 AM

## 2017-06-18 NOTE — Progress Notes (Signed)
      [address]       [place] [id number]             [phone]      5 Days Post-Op Procedure(s) (LRB): VIDEO ASSISTED THORACOSCOPY (VATS)/DRAIN EMPYEMA (Right) THORACOTOMY MAJOR (Right)   Subjective:  No new complaints.  Feels better after chest tube removal.    Objective: Vital signs in last 24 hours: Temp:  [98.3 F (36.8 C)-99.1 F (37.3 C)] 98.9 F (37.2 C) (06/05 0300) Pulse Rate:  [90-104] 95 (06/05 0300) Resp:  [16-18] 18 (06/05 0300) BP: (114-140)/(66-89) 114/66 (06/05 0300) SpO2:  [96 %-100 %] 96 % (06/05 0300) Weight:  [265 lb 3.4 oz (120.3 kg)] 265 lb 3.4 oz (120.3 kg) (06/05 0300)  Intake/Output from previous day: 06/04 0701 - 06/05 0700 In: 2620 [P.O.:720; IV Piggyback:1900] Out: 600 [Urine:600]  General appearance: alert, cooperative and no distress Heart: regular rate and rhythm Lungs: wheezes LUL Abdomen: soft, non-tender; bowel sounds normal; no masses,  no organomegaly Extremities: extremities normal, atraumatic, no cyanosis or edema Wound: clean and dry  Lab Results: Recent Labs    06/17/17 0301  WBC 6.0  HGB 10.3*  HCT 31.7*  PLT 512*   BMET:  Recent Labs    06/17/17 0301  NA 135  K 3.4*  CL 101  CO2 27  GLUCOSE 184*  BUN 9  CREATININE 0.73  CALCIUM 7.9*    PT/INR: No results for input(s): LABPROT, INR in the last 72 hours. ABG    Component Value Date/Time   PHART 7.434 06/14/2017 0431   HCO3 26.7 06/14/2017 0431   TCO2 28 06/14/2017 0431   O2SAT 97.0 06/14/2017 0431   CBG (last 3)  No results for input(s): GLUCAP in the last 72 hours.  Assessment/Plan: S/P Procedure(s) (LRB): VIDEO ASSISTED THORACOSCOPY (VATS)/DRAIN EMPYEMA (Right) THORACOTOMY MAJOR (Right)  1. Pulm- chest tube removed yesterday, follow up CXR remains stable with post surgical changes 2. ID- low grade temp at times... Cultures are growing Parvimonas Micra and Fusobacterium Nucleatum.. Sensitivities remain pending.. Both are  dental bacteria and patient will require follow up with Dentist at discharge 3. CV- hemodynamically stable 4. Dispo- patient stable, okay to d/c from our standpoint once antibiotic treatment can be determined, follow up appointments have been made and are in the chart   LOS: 7 days    Lowella Dandyrin Jhoselyn Ruffini 06/18/2017

## 2017-06-27 ENCOUNTER — Ambulatory Visit (INDEPENDENT_AMBULATORY_CARE_PROVIDER_SITE_OTHER): Payer: Self-pay

## 2017-06-27 ENCOUNTER — Other Ambulatory Visit: Payer: Self-pay

## 2017-06-27 DIAGNOSIS — Z4802 Encounter for removal of sutures: Secondary | ICD-10-CM

## 2017-06-27 NOTE — Progress Notes (Signed)
Patient arrived for nurse visit to remove 2 sutures/ 18 staples post- procedure VATS/ Empyema 06/13/2017 with Dr. Laneta SimmersBartle.  Suture/staples removed with no signs/ symptoms of infection noted.  Patient tolerated procedure well.  Patient/ family instructed to keep the incision sites clean and dry.  Patient/ family acknowledged instructions given.   Patient also requested a note for work, to remain out until cleared by Dr. Laneta SimmersBartle.

## 2017-07-08 ENCOUNTER — Other Ambulatory Visit: Payer: Self-pay | Admitting: Surgery

## 2017-07-08 DIAGNOSIS — J869 Pyothorax without fistula: Secondary | ICD-10-CM

## 2017-07-09 ENCOUNTER — Other Ambulatory Visit: Payer: Self-pay

## 2017-07-09 ENCOUNTER — Ambulatory Visit
Admission: RE | Admit: 2017-07-09 | Discharge: 2017-07-09 | Disposition: A | Discharge status: Home / Self Care | Payer: Self-pay | Source: Ambulatory Visit | Attending: Surgery | Admitting: Surgery

## 2017-07-09 ENCOUNTER — Encounter: Payer: Self-pay | Admitting: Surgery

## 2017-07-09 ENCOUNTER — Ambulatory Visit (INDEPENDENT_AMBULATORY_CARE_PROVIDER_SITE_OTHER): Payer: Self-pay | Admitting: Surgery

## 2017-07-09 VITALS — BP 140/88 | HR 96 | Resp 20 | Ht 72.0 in | Wt 267.0 lb

## 2017-07-09 DIAGNOSIS — Z09 Encounter for follow-up examination after completed treatment for conditions other than malignant neoplasm: Secondary | ICD-10-CM

## 2017-07-09 DIAGNOSIS — J869 Pyothorax without fistula: Secondary | ICD-10-CM

## 2017-07-09 NOTE — Progress Notes (Signed)
     HPI: Patient returns for routine postoperative follow-up having undergone right thoracotomy with drainage of empyema and decortication of the right lung on 06/13/2017. The patient's early postoperative recovery while in the hospital was notable for an uncomplicated postoperative course.  The cultures grew moderate Parvimonas and moderate Fusobacterium.  Infectious disease recommended using Augmentin for 3 to 4 weeks.  He just completed his oral antibiotics. Since hospital discharge the patient reports that he has been feeling much better.  He denies any fever or chills.  Denies any cough or sputum production.  He still has some right chest wall pain but is improving and he is no longer taking oxycodone.   No current outpatient medications on file.   No current facility-administered medications for this visit.     Physical Exam: BP 140/88   Pulse 96   Resp 20   Ht 6' (1.829 m)   Wt 267 lb (121.1 kg)   SpO2 97% Comment: RA  BMI 36.21 kg/m  He looks well. Lung exam reveals clear breath sounds bilaterally. The right chest incision is well-healed.  Diagnostic Tests:  Chest x-ray today shows improved aeration in the right lower lobe with mild residual inflammatory changes along the right costophrenic angle.  Impression:  Overall I think he is making good progress following his surgery.  I encouraged him to continue abstaining from smoking.  I also encouraged him to follow-up with dentistry since the source of this infection certainly could be mouth bacteria and he does not receive regular dental checkups.  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  I told him that he can use ibuprofen as needed for pain.  If he continues to improve then he does not need any further follow-up.  He will return to see me if he develops any problems with his incisions or if his chest wall pain does not continue to  improve.  Plan:  Follow-up with me as needed.   Alleen BorneBryan K Bartle, MD [facility] [phone]

## 2017-07-10 ENCOUNTER — Inpatient Hospital Stay (INDEPENDENT_AMBULATORY_CARE_PROVIDER_SITE_OTHER): Payer: Self-pay | Admitting: Physician Assistant

## 2017-07-15 ENCOUNTER — Emergency Department (HOSPITAL_COMMUNITY)
Admission: EM | Admit: 2017-07-15 | Discharge: 2017-07-15 | Disposition: A | Discharge status: Home / Self Care | Payer: Self-pay | Attending: Emergency Medicine | Admitting: Emergency Medicine

## 2017-07-15 ENCOUNTER — Encounter (HOSPITAL_COMMUNITY): Payer: Self-pay

## 2017-07-15 ENCOUNTER — Other Ambulatory Visit: Payer: Self-pay

## 2017-07-15 DIAGNOSIS — F17210 Nicotine dependence, cigarettes, uncomplicated: Secondary | ICD-10-CM | POA: Insufficient documentation

## 2017-07-15 DIAGNOSIS — K047 Periapical abscess without sinus: Secondary | ICD-10-CM | POA: Insufficient documentation

## 2017-07-15 DIAGNOSIS — K029 Dental caries, unspecified: Secondary | ICD-10-CM | POA: Insufficient documentation

## 2017-07-15 LAB — FUNGUS CULTURE WITH STAIN

## 2017-07-15 LAB — FUNGAL ORGANISM REFLEX

## 2017-07-15 LAB — FUNGUS CULTURE RESULT

## 2017-07-15 MED ORDER — CLINDAMYCIN HCL 150 MG PO CAPS: 300.0000 mg | ORAL_CAPSULE | Freq: Three times a day (TID) | ORAL | 0 refills | Status: AC

## 2017-07-15 NOTE — Discharge Instructions (Signed)
Take antibiotics as prescribed.  Take the entire course, even if your symptoms improve. Take ibuprofen 3 times a day with meals. You may take up to 800 mg (4 pills) at a time. Do not take other anti-inflammatories at the same time open (Advil, Motrin, naproxen, Aleve). You may supplement with Tylenol if you need further pain control. Follow-up with a dentist.  If you are having difficulty getting in with the dentist you found, there is information about other dentists in the area. Return to the emergency room if you develop fevers, difficulty breathing, difficulty opening her mouth, or any new or concerning symptoms.

## 2017-07-15 NOTE — ED Triage Notes (Signed)
Pt c/o right lower dental pain, states he has an abscess and needs abx for infection

## 2017-07-15 NOTE — ED Provider Notes (Signed)
[facility]-EMERGENCY DEPT Provider Note   CSN: [id number] Arrival date & time: 07/15/17  1602     History   Chief Complaint Chief Complaint  Patient presents with  . Dental Pain    [name] is a 37 y.o. male presenting for evaluation of dental pain.  Patient states for the past 3 days, he has been having right lower tooth pain.  He has been taking ibuprofen intermittently without significant improvement of pain.  He denies fevers, chills, difficulty breathing, difficulty opening his mouth, or difficult to swallowing.  He has no medical problems, takes no medications daily.  He recently had an empyema with a chest tube, finished 3 to 4 weeks of Augmentin the end of June (2 wks ago).  He has found a dentist that he is scheduling appointment with in the near future.  Pain has been constant, nothing makes it better or worse.  HPI  History reviewed. No pertinent past medical history.  Patient Active Problem List   Diagnosis Date Noted  . Empyema (HCC) 06/13/2017  . Right lower lobe pneumonia (HCC) 06/11/2017    Past Surgical History:  Procedure Laterality Date  . HAND SURGERY    . THORACOTOMY Right 06/13/2017   Procedure: THORACOTOMY MAJOR;  Surgeon: Alleen BorneBartle, Bryan K, MD;  [facility];  Service: Thoracic;  Laterality: Right;  Marland Kitchen. VIDEO ASSISTED THORACOSCOPY (VATS)/EMPYEMA Right 06/13/2017   Procedure: VIDEO ASSISTED THORACOSCOPY (VATS)/DRAIN EMPYEMA;  Surgeon: Alleen BorneBartle, Bryan K, MD;  Location: [facility] OR;  Service: Thoracic;  Laterality: Right;        Home Medications    Prior to Admission medications   Medication Sig Start Date End Date Taking? Authorizing Provider  clindamycin (CLEOCIN) 150 MG capsule Take 2 capsules (300 mg total) by mouth 3 (three) times daily for 7 days. 07/15/17 07/22/17  Kell Ferris, PA-C    Family History Family History  Problem Relation Age of Onset  . Diabetes Mother   . Diabetes Father     Social History Social  History   Tobacco Use  . Smoking status: Current Every Day Smoker    Packs/day: 1.00    Years: 20.00    Pack years: 20.00    Types: Cigarettes  . Smokeless tobacco: Never Used  Substance Use Topics  . Alcohol use: No  . Drug use: No     Allergies   Patient has no known allergies.   Review of Systems Review of Systems  Constitutional: Negative for fever.  HENT: Positive for dental problem.      Physical Exam Updated Vital Signs BP (!) 159/98 (BP Location: Left Arm)   Pulse 79   Temp 98.2 F (36.8 C) (Oral)   Resp 14   Ht 6' (1.829 m)   Wt 121.1 kg (267 lb)   SpO2 100%   BMI 36.21 kg/m   Physical Exam  Constitutional: He is oriented to person, place, and time. He appears well-developed and well-nourished. No distress.  HENT:  Head: Normocephalic and atraumatic.  Mouth/Throat: Uvula is midline, oropharynx is clear and moist and mucous membranes are normal. No trismus in the jaw. Abnormal dentition. Dental caries present. No uvula swelling.    Overall poor dentition with multiple rotting teeth.  Tenderness of the right lower teeth with surrounding gum erythema and edema.  No trismus.  No pain under the tongue.  No facial swelling.  Handling secretions easily.  Eyes: EOM are normal.  Neck: Normal range of motion.  Cardiovascular: Normal  rate, regular rhythm and intact distal pulses.  Pulmonary/Chest: Effort normal and breath sounds normal. No respiratory distress. He has no wheezes.  Abdominal: He exhibits no distension.  Musculoskeletal: Normal range of motion.  Neurological: He is alert and oriented to person, place, and time.  Skin: Skin is warm. No rash noted.  Psychiatric: He has a normal mood and affect.  Nursing note and vitals reviewed.    ED Treatments / Results  Labs (all labs ordered are listed, but only abnormal results are displayed) Labs Reviewed - No data to display  EKG None  Radiology No results found.  Procedures Procedures  (including critical care time)  Medications Ordered in ED Medications - No data to display   Initial Impression / Assessment and Plan / ED Course  I have reviewed the triage vital signs and the nursing notes.  Pertinent labs & imaging results that were available during my care of the patient were reviewed by me and considered in my medical decision making (see chart for details).     Patient presenting for evaluation of dental pain.  Physical exam reassuring, he is afebrile not tachycardic.  Appears nontoxic.  Oral exam shows overall poor dentition with multiple rotting teeth.  Tenderness of the right lower teeth with surrounding gum erythema and edema.  No obvious drainable abscess at this time.  No swelling, trismus, or pain under the tongue, doubt Ludwig's.  As patient was recently on Augmentin, will start patient on clindamycin to avoid penicillins/to treat penicillin resistant bacteria.  Discussed with patient.  Discussed importance of follow-up with dentistry.  At this time, patient present for discharge.  Return precautions given.  Patient states he understands and agrees to plan.  Final Clinical Impressions(s) / ED Diagnoses   Final diagnoses:  Dental caries  Dental infection    ED Discharge Orders        Ordered    clindamycin (CLEOCIN) 150 MG capsule  3 times daily     07/15/17 1638       Theadora Noyes, PA-C 07/15/17 1639    Benjiman Core, MD 07/16/17 0020

## 2017-07-28 LAB — ACID FAST CULTURE WITH REFLEXED SENSITIVITIES (MYCOBACTERIA)

## 2017-07-28 LAB — ACID FAST CULTURE WITH REFLEXED SENSITIVITIES: ACID FAST CULTURE - AFSCU3: NEGATIVE

## 2017-08-09 ENCOUNTER — Other Ambulatory Visit: Payer: Self-pay

## 2017-08-09 ENCOUNTER — Encounter (HOSPITAL_COMMUNITY): Payer: Self-pay | Admitting: Emergency Medicine

## 2017-08-09 ENCOUNTER — Emergency Department (HOSPITAL_COMMUNITY)
Admission: EM | Admit: 2017-08-09 | Discharge: 2017-08-09 | Disposition: A | Discharge status: Home / Self Care | Payer: Self-pay | Attending: Emergency Medicine | Admitting: Emergency Medicine

## 2017-08-09 DIAGNOSIS — K047 Periapical abscess without sinus: Secondary | ICD-10-CM | POA: Insufficient documentation

## 2017-08-09 DIAGNOSIS — K0889 Other specified disorders of teeth and supporting structures: Secondary | ICD-10-CM

## 2017-08-09 DIAGNOSIS — F17210 Nicotine dependence, cigarettes, uncomplicated: Secondary | ICD-10-CM | POA: Insufficient documentation

## 2017-08-09 MED ORDER — CHLORHEXIDINE GLUCONATE 0.12 % MT SOLN
15.0000 mL | Freq: Two times a day (BID) | OROMUCOSAL | 0 refills | Status: AC
Start: 2017-08-09 — End: ?

## 2017-08-09 MED ORDER — AMOXICILLIN-POT CLAVULANATE 875-125 MG PO TABS: 1.0000 | ORAL_TABLET | Freq: Two times a day (BID) | ORAL | 0 refills | Status: AC

## 2017-08-09 MED ORDER — CHLORHEXIDINE GLUCONATE 0.12 % MT SOLN: 15.0000 mL | Freq: Two times a day (BID) | OROMUCOSAL | 0 refills | Status: DC

## 2017-08-09 NOTE — ED Triage Notes (Addendum)
Pt c/o R lower dental pain x 4 days.

## 2017-08-09 NOTE — Discharge Instructions (Addendum)
Please be sure to follow-up with your DENTIST as soon as possible for further evaluation and treatment. Please be sure to take all of your antibiotic, Augmentin, as prescribed. You may also use the antiseptic mouthwash, Peridex, as prescribed. Please be sure to follow-up with your primary care provider as well regarding your visit today. Please return to the emergency department for any new or worsening symptoms.  Contact a health care provider if: Your pain is worse and is not helped by medicine. Get help right away if: You have a fever or chills. Your symptoms suddenly get worse. You have a very bad headache. You have problems breathing or swallowing. You have trouble opening your mouth. You have swelling in your neck or around your eye.

## 2017-08-09 NOTE — ED Provider Notes (Signed)
[facility]-EMERGENCY DEPT Provider Note   CSN: [id number] Arrival date & time: 08/09/17  [id number]     History   Chief Complaint Chief Complaint  Patient presents with  . Dental Pain    [name] is a 37 y.o. male presenting for 3 days of right lower dental pain.  Patient describes pain as a throbbing 6/10 in severity worse with chewing on that side.  Patient states that he has not taken anything for his pain.  Patient denies fever, difficulty swallowing, trouble opening his mouth, change to his voice, drooling, drainage, nausea/vomiting, neck pain, shortness of breath, chest pain, abdominal pain.  Patient denies history of chronic medical conditions.   Patient was seen in the emergency department for the same problem on 07/15/2017.  He was treated with clindamycin at that time and scheduled a follow-up with a dentist.  Patient states that the pain resolved however he was unable to follow-up with a dentist due to work schedule conflict.  Patient states that he was asymptomatic until 3 days ago.  HPI  History reviewed. No pertinent past medical history.  Patient Active Problem List   Diagnosis Date Noted  . Empyema (HCC) 06/13/2017  . Right lower lobe pneumonia (HCC) 06/11/2017    Past Surgical History:  Procedure Laterality Date  . HAND SURGERY    . THORACOTOMY Right 06/13/2017   Procedure: THORACOTOMY MAJOR;  Surgeon: Alleen BorneBartle, Bryan K, MD;  [facility];  Service: Thoracic;  Laterality: Right;  Marland Kitchen. VIDEO ASSISTED THORACOSCOPY (VATS)/EMPYEMA Right 06/13/2017   Procedure: VIDEO ASSISTED THORACOSCOPY (VATS)/DRAIN EMPYEMA;  Surgeon: Alleen BorneBartle, Bryan K, MD;  Location: [facility] OR;  Service: Thoracic;  Laterality: Right;        Home Medications    Prior to Admission medications   Medication Sig Start Date End Date Taking? Authorizing Provider  amoxicillin-clavulanate (AUGMENTIN) 875-125 MG tablet Take 1 tablet by mouth 2 (two) times daily for 7 days. 08/09/17  08/16/17  Harlene SaltsMorelli, Saje Gallop A, PA-C  chlorhexidine (PERIDEX) 0.12 % solution Use as directed 15 mLs in the mouth or throat 2 (two) times daily. 08/09/17   Harlene SaltsMorelli, Gitel Beste A, PA-C  ibuprofen (ADVIL,MOTRIN) 200 MG tablet Take 400 mg by mouth every 6 (six) hours as needed for moderate pain.    [provider]  nicotine (NICODERM CQ - DOSED IN MG/24 HR) 7 mg/24hr patch Place 7 mg onto the skin daily.    [provider]    Family History Family History  Problem Relation Age of Onset  . Diabetes Mother   . Diabetes Father     Social History Social History   Tobacco Use  . Smoking status: Current Every Day Smoker    Packs/day: 1.00    Years: 20.00    Pack years: 20.00    Types: Cigarettes  . Smokeless tobacco: Never Used  Substance Use Topics  . Alcohol use: No  . Drug use: No     Allergies   Patient has no known allergies.   Review of Systems Review of Systems  Constitutional: Negative.  Negative for chills, fatigue and fever.  HENT: Positive for dental problem. Negative for drooling, facial swelling, sore throat, trouble swallowing and voice change.   Eyes: Negative.  Negative for visual disturbance.  Respiratory: Negative.  Negative for cough and shortness of breath.   Cardiovascular: Negative.  Negative for chest pain.  Gastrointestinal: Negative.  Negative for abdominal pain, blood in stool, diarrhea, nausea and vomiting.  Genitourinary: Negative.  Negative for dysuria and hematuria.  Musculoskeletal: Negative.  Negative for arthralgias and myalgias.  Skin: Negative.  Negative for rash.  Neurological: Negative.  Negative for dizziness, syncope, speech difficulty, weakness and headaches.     Physical Exam Updated Vital Signs BP (!) 148/84 (BP Location: Left Arm)   Pulse 77   Temp 97.9 F (36.6 C) (Oral)   Resp 18   SpO2 99%   Physical Exam  Constitutional: He is oriented to person, place, and time. He appears well-developed and well-nourished. No  distress.  HENT:  Head: Normocephalic and atraumatic.  Right Ear: External ear normal.  Left Ear: External ear normal.  Nose: Nose normal.  Mouth/Throat: Uvula is midline, oropharynx is clear and moist and mucous membranes are normal. No trismus in the jaw. Dental abscesses and dental caries present. No uvula swelling. No oropharyngeal exudate, posterior oropharyngeal edema, posterior oropharyngeal erythema or tonsillar abscesses.    Multiple dental cavities noted.  Poor dentition overall. Far back lower right tooth, 32 is tender to percussion with mild surrounding gingival erythema and pinpoint nondraining abscess present.  No signs suggesting retropharyngeal abscess, Ludwig's angina, or peritonsillar abscess.  Eyes: Pupils are equal, round, and reactive to light. EOM are normal.  Neck: Trachea normal, normal range of motion, full passive range of motion without pain and phonation normal. Neck supple. No JVD present. No tracheal tenderness and no muscular tenderness present. No neck rigidity. No tracheal deviation, no edema, no erythema and normal range of motion present.  No swelling present at this time, no signs of Ludwig's angina.  Cardiovascular: Normal rate and regular rhythm.  Pulmonary/Chest: Effort normal. No respiratory distress.  Abdominal: Soft. There is no tenderness. There is no rebound and no guarding.  Musculoskeletal: Normal range of motion.  Neurological: He is alert and oriented to person, place, and time.  Skin: Skin is warm and dry. Capillary refill takes less than 2 seconds.  Psychiatric: He has a normal mood and affect. His behavior is normal.     ED Treatments / Results  Labs (all labs ordered are listed, but only abnormal results are displayed) Labs Reviewed - No data to display  EKG None  Radiology No results found.  Procedures Procedures (including critical care time)  Medications Ordered in ED Medications - No data to display   Initial  Impression / Assessment and Plan / ED Course  I have reviewed the triage vital signs and the nursing notes.  Pertinent labs & imaging results that were available during my care of the patient were reviewed by me and considered in my medical decision making (see chart for details).    Patient with dental pain. Multiple infected dental surface cavities overall poor dentition.  Patient with small pinpoint dental abscess to the 32nd tooth, non-draining.   Patient is well-appearing, afebrile, nontoxic, speaking well.  Patient able to swallow without pain.  No signs of swelling or concern for Ludwig's angina, Peritonsilar abscess, Retropharyngeal abscess or other deep tissue infections.  No sign of swelling of the neck, patient has good range of motion of the neck, no trismus.  I have prescribed the patient Augmentin 875 twice daily for 7 days as well as chlorhexidine mouthwash.  I have informed the patient that he can use over-the-counter anti-inflammatories for pain as needed and directed on the bottle/packaging.  Patient states that he has a dentist he can follow-up with, he states that it is working schedule conflicts that have kept him from doing so far.  I have strongly encouraged the patient to follow-up with his dentist as soon as possible for further evaluation and treatment.  At this time there does not appear to be any evidence of an acute emergency medical condition and the patient appears stable for discharge with appropriate outpatient follow up. Diagnosis was discussed with patient who verbalizes understanding and is agreeable to discharge. I have discussed return precautions with patient who verbalizes understanding of return precautions. Patient strongly encouraged to follow-up with their PCP.  Patient's case discussed with Dr. Effie Shy who agrees with plan to discharge with follow-up.     This note was dictated using DragonOne dictation software; please contact for any inconsistencies  within the note.   Final Clinical Impressions(s) / ED Diagnoses   Final diagnoses:  Dental abscess  Pain, dental    ED Discharge Orders        Ordered    amoxicillin-clavulanate (AUGMENTIN) 875-125 MG tablet  2 times daily     08/09/17 1250    chlorhexidine (PERIDEX) 0.12 % solution  2 times daily,   Status:  Discontinued     08/09/17 1250    chlorhexidine (PERIDEX) 0.12 % solution  2 times daily     08/09/17 1253       Elizabeth Palau 08/09/17 1734    Mancel Bale, MD 08/09/17 [phone]

## 2017-09-07 ENCOUNTER — Encounter (HOSPITAL_COMMUNITY): Payer: Self-pay

## 2017-09-07 ENCOUNTER — Emergency Department (HOSPITAL_COMMUNITY)
Admission: EM | Admit: 2017-09-07 | Discharge: 2017-09-07 | Disposition: A | Discharge status: Home / Self Care | Payer: Self-pay | Attending: Emergency Medicine | Admitting: Emergency Medicine

## 2017-09-07 DIAGNOSIS — F17210 Nicotine dependence, cigarettes, uncomplicated: Secondary | ICD-10-CM | POA: Insufficient documentation

## 2017-09-07 DIAGNOSIS — K047 Periapical abscess without sinus: Secondary | ICD-10-CM | POA: Insufficient documentation

## 2017-09-07 MED ORDER — CLINDAMYCIN HCL 300 MG PO CAPS: 300.0000 mg | ORAL_CAPSULE | Freq: Three times a day (TID) | ORAL | 0 refills | Status: AC

## 2017-09-07 NOTE — ED Provider Notes (Signed)
[facility]ENC[facility] Provider Note   CSN: [id number] Arrival date & time: 09/07/17  1428     History   Chief Complaint Chief Complaint  Patient presents with  . Dental Pain    HPI Lemar LivingsKevin L Charity is a 37 y.o. male who presents emergency department today for right lower dental pain.  Patient has been seen here in the past for the same and prescribed antibiotics with improvement of his symptoms.  He reports he has a follow-up with his dentist on 9/10.  He reports over the last 2 days however he has been noticing swelling on his lower gumline next to his right incisor that he thinks may be an abscess.  He is presenting for drainage and antibiotics.  He reports his pain is controlled with over-the-counter medications.  He reports nothing makes his symptoms worse. Denies fever, chills, voice change, inability to control secretions, nausea/vomiting, facial swelling, dysphagia, odynophagia, drainage or trauma  HPI  History reviewed. No pertinent past medical history.  Patient Active Problem List   Diagnosis Date Noted  . Empyema (HCC) 06/13/2017  . Right lower lobe pneumonia (HCC) 06/11/2017    Past Surgical History:  Procedure Laterality Date  . HAND SURGERY    . THORACOTOMY Right 06/13/2017   Procedure: THORACOTOMY MAJOR;  Surgeon: Alleen BorneBartle, Bryan K, MD;  [facility];  Service: Thoracic;  Laterality: Right;  Marland Kitchen. VIDEO ASSISTED THORACOSCOPY (VATS)/EMPYEMA Right 06/13/2017   Procedure: VIDEO ASSISTED THORACOSCOPY (VATS)/DRAIN EMPYEMA;  Surgeon: Alleen BorneBartle, Bryan K, MD;  Location: [facility] OR;  Service: Thoracic;  Laterality: Right;        Home Medications    Prior to Admission medications   Medication Sig Start Date End Date Taking? Authorizing Provider  chlorhexidine (PERIDEX) 0.12 % solution Use as directed 15 mLs in the mouth or throat 2 (two) times daily. 08/09/17   Harlene SaltsMorelli, Brandon A, PA-C  ibuprofen (ADVIL,MOTRIN) 200 MG tablet Take 400 mg by mouth every 6  (six) hours as needed for moderate pain.    [provider]  nicotine (NICODERM CQ - DOSED IN MG/24 HR) 7 mg/24hr patch Place 7 mg onto the skin daily.    [provider]    Family History Family History  Problem Relation Age of Onset  . Diabetes Mother   . Diabetes Father     Social History Social History   Tobacco Use  . Smoking status: Current Every Day Smoker    Packs/day: 1.00    Years: 20.00    Pack years: 20.00    Types: Cigarettes  . Smokeless tobacco: Never Used  Substance Use Topics  . Alcohol use: No  . Drug use: No     Allergies   Patient has no known allergies.   Review of Systems Review of Systems  All other systems reviewed and are negative.    Physical Exam Updated Vital Signs BP (!) 150/101 (BP Location: Right Arm)   Pulse 80   Temp 99 F (37.2 C) (Oral)   Resp 16   Ht 6\' 3"  (1.905 m)   Wt 115.7 kg   SpO2 99%   BMI 31.87 kg/m   Physical Exam  Constitutional: He appears well-developed and well-nourished.  HENT:  Head: Normocephalic and atraumatic.  Right Ear: External ear normal.  Left Ear: External ear normal.  The patient has normal phonation and is in control of secretions. No stridor.  Midline uvula without edema. Soft palate rises symmetrically.  No tonsillar erythema or exudates.  No PTA. Tongue protrusion is normal. No trismus. No creptius on neck palpation.  No facial or neck swelling.  No submandibular or submental edema or lymphadenopathy noted.  Patient with multiple dental cavities noted and overall poor dentition.  Patient with a periapical abscess noted to the right lower incisor.  No drainage at this current time.  No floor edema.  And patient has good dentition. No gingival erythema or fluctuance noted. Mucus membranes moist.   Eyes: Conjunctivae are normal. Right eye exhibits no discharge. Left eye exhibits no discharge. No scleral icterus.  Neck:  No crepitus on neck palpation.  No meningeal signs.  No  stridor. No swelling  Pulmonary/Chest: Effort normal. No respiratory distress.  Lymphadenopathy:       Head (right side): No submental, no submandibular and no tonsillar adenopathy present.       Head (left side): No submental, no submandibular and no tonsillar adenopathy present.    He has no cervical adenopathy.  Neurological: He is alert.  Skin: Skin is warm and dry. Capillary refill takes less than 2 seconds. No rash noted. No pallor.  Psychiatric: He has a normal mood and affect.  Nursing note and vitals reviewed.    ED Treatments / Results  Labs (all labs ordered are listed, but only abnormal results are displayed) Labs Reviewed - No data to display  EKG None  Radiology No results found.  Procedures .Marland KitchenIncision and Drainage Date/Time: 09/07/2017 3:47 PM Performed by: Jacinto Halim, PA-C Authorized by: Jacinto Halim, PA-C   Consent:    Consent obtained:  Verbal   Consent given by:  Patient   Risks discussed:  Damage to other organs, bleeding, incomplete drainage, infection and pain   Alternatives discussed:  No treatment and referral Location:    Type:  Abscess   Size:  0.5cm   Location:  Mouth (Dental)   Mouth location: Periapical. Anesthesia (see MAR for exact dosages):    Anesthesia method:  Topical application   Topical anesthetic:  Benzocaine gel Procedure type:    Complexity:  Simple Procedure details:    Needle aspiration: yes     Needle size:  18 G   Drainage:  Purulent and bloody   Drainage amount:  Moderate   Wound treatment:  Wound left open   Packing materials:  None Post-procedure details:    Patient tolerance of procedure:  Tolerated well, no immediate complications   (including critical care time)  Medications Ordered in ED Medications - No data to display   Initial Impression / Assessment and Plan / ED Course  I have reviewed the triage vital signs and the nursing notes.  Pertinent labs & imaging results that were available  during my care of the patient were reviewed by me and considered in my medical decision making (see chart for details).     37 y.o. male with periapical abscess that was successfully needle aspirated in the department.  Will start patient on clindamycin.  Exam on concerning for Ludwig's or spread of infection.  Patient deferred pain medication the department states he has Tylenol and ibuprofen at home.  He does not wish for pain medication prescription.  Patient has follow-up with dentist on 9/10 for this.  Advised him to keep this appointment.  He is return sooner for worsening symptoms.  Return precautions discussed.  Time was given for all questions to be answered.  Final Clinical Impressions(s) / ED Diagnoses   Final diagnoses:  Dental abscess    ED  Discharge Orders         Ordered    clindamycin (CLEOCIN) 300 MG capsule  3 times daily     09/07/17 1551           Princella Pellegrini 09/07/17 1552    Gerhard Munch, MD 09/07/17 [phone]

## 2017-09-07 NOTE — ED Triage Notes (Signed)
Pt presents for evaluation of dental pain on R lower. States had infection in late July, treated with abx. Pain returned yesterday. Has appointment 9/10 for removal of tooth.

## 2017-09-07 NOTE — ED Notes (Signed)
Patient verbalizes understanding of discharge instructions. Opportunity for questioning and answers were provided. Armband removed by staff, pt discharged from ED ambulatory.   

## 2017-09-07 NOTE — Discharge Instructions (Signed)
Please take all of your antibiotics until finished!   You may develop abdominal discomfort or diarrhea from the antibiotic.  You may help offset this with probiotics which you can buy or get in yogurt. Do not eat or take the probiotics until 2 hours after your antibiotic. Do not take your medicine if develop an itchy rash, swelling in your mouth or lips, or difficulty breathing.   For pain control you may take: 800mg  of ibuprofen (that is usually four 200mg  over the counter pills) up to 3 times a day (please take with food) and acetaminophen 975mg  (this is 3 normal strength, 325mg , over the counter pills) up to four times a day. Please do not take more than this. Do not drink alcohol or combine with other medications that have acetaminophen or Ibuprofen as an ingredient (Read the labels!).   Follow attached handout. If you develop worsening or new concerning symptoms you can return to the emergency department for re-evaluation.

## 2017-11-09 ENCOUNTER — Encounter (HOSPITAL_COMMUNITY): Payer: Self-pay | Admitting: Emergency Medicine

## 2017-11-09 ENCOUNTER — Other Ambulatory Visit: Payer: Self-pay

## 2017-11-09 ENCOUNTER — Emergency Department (HOSPITAL_COMMUNITY)
Admission: EM | Admit: 2017-11-09 | Discharge: 2017-11-09 | Disposition: A | Discharge status: Home / Self Care | Payer: Self-pay | Attending: Emergency Medicine | Admitting: Emergency Medicine

## 2017-11-09 DIAGNOSIS — Z79899 Other long term (current) drug therapy: Secondary | ICD-10-CM | POA: Insufficient documentation

## 2017-11-09 DIAGNOSIS — F17210 Nicotine dependence, cigarettes, uncomplicated: Secondary | ICD-10-CM | POA: Insufficient documentation

## 2017-11-09 DIAGNOSIS — K029 Dental caries, unspecified: Secondary | ICD-10-CM | POA: Insufficient documentation

## 2017-11-09 MED ORDER — CLINDAMYCIN HCL 150 MG PO CAPS: 300.0000 mg | ORAL_CAPSULE | Freq: Three times a day (TID) | ORAL | 0 refills | Status: AC

## 2017-11-09 NOTE — Discharge Instructions (Signed)
Your evaluated today for dental pain.  I do not see any evidence of an abscess, however this could be the beginning symptoms of one.  I have prescribed you clindamycin.  Please take as prescribed.  I would recommend calling your dentist and following up tomorrow morning.  Return to the ED for any new or worsening symptoms

## 2017-11-09 NOTE — ED Provider Notes (Signed)
[facility] EMERGENCY DEPARTMENT Provider Note   CSN: [id number] Arrival date & time: 11/09/17  7829     History   Chief Complaint Chief Complaint  Patient presents with  . Dental Pain    [name] is a 37 y.o. male past medical history significant for recurrent periapical abscesses, dental caries who presents for evaluation of dental pain.  Patient states he has had left-sided lower jaw pain which began on Thursday.  Pain is been worsening.  Patient states he feels like this is the beginning of a dental abscess.  He has had multiple in the past.  Rates his pain a 4/10.  Has been taking Tylenol and ibuprofen with relief of his symptoms, however states he "needs antibiotics."  Denies fever, chills, difficulty tolerating liquids, neck pain, upper respiratory symptoms, difficulty swallowing, oropharyngeal swelling.  History obtained from patient.  No interpreter was used.  HPI  History reviewed. No pertinent past medical history.  Patient Active Problem List   Diagnosis Date Noted  . Empyema (HCC) 06/13/2017  . Right lower lobe pneumonia (HCC) 06/11/2017    Past Surgical History:  Procedure Laterality Date  . HAND SURGERY    . THORACOTOMY Right 06/13/2017   Procedure: THORACOTOMY MAJOR;  Surgeon: Alleen Borne, MD;  Location: [facility] OR;  Service: Thoracic;  Laterality: Right;  Marland Kitchen VIDEO ASSISTED THORACOSCOPY (VATS)/EMPYEMA Right 06/13/2017   Procedure: VIDEO ASSISTED THORACOSCOPY (VATS)/DRAIN EMPYEMA;  Surgeon: Alleen Borne, MD;  Location: [facility] OR;  Service: Thoracic;  Laterality: Right;        Home Medications    Prior to Admission medications   Medication Sig Start Date End Date Taking? Authorizing Provider  chlorhexidine (PERIDEX) 0.12 % solution Use as directed 15 mLs in the mouth or throat 2 (two) times daily. 08/09/17   Harlene Salts A, PA-C  clindamycin (CLEOCIN) 150 MG capsule Take 2 capsules (300 mg total) by mouth 3 (three) times daily  for 7 days. 11/09/17 11/16/17  Tamerra Merkley A, PA-C  ibuprofen (ADVIL,MOTRIN) 200 MG tablet Take 400 mg by mouth every 6 (six) hours as needed for moderate pain.    [provider]  nicotine (NICODERM CQ - DOSED IN MG/24 HR) 7 mg/24hr patch Place 7 mg onto the skin daily.    [provider]    Family History Family History  Problem Relation Age of Onset  . Diabetes Mother   . Diabetes Father     Social History Social History   Tobacco Use  . Smoking status: Current Every Day Smoker    Packs/day: 1.00    Years: 20.00    Pack years: 20.00    Types: Cigarettes  . Smokeless tobacco: Never Used  Substance Use Topics  . Alcohol use: No  . Drug use: No     Allergies   Patient has no known allergies.   Review of Systems Review of Systems  Constitutional: Negative.   HENT: Positive for dental problem. Negative for congestion, drooling, ear discharge, ear pain, facial swelling, hearing loss, mouth sores, nosebleeds, postnasal drip, rhinorrhea, sinus pressure, sinus pain, sneezing, sore throat, tinnitus, trouble swallowing and voice change.   Respiratory: Negative.   Cardiovascular: Negative.   Gastrointestinal: Negative.   Genitourinary: Negative.   Musculoskeletal: Negative.   Skin: Negative.   All other systems reviewed and are negative.    Physical Exam Updated Vital Signs BP (!) 135/98 (BP Location: Right Arm)   Pulse 79   Temp 98.7 F (37.1  C) (Oral)   Resp 17   Ht 6\' 3"  (1.905 m)   Wt 120.2 kg   SpO2 97%   BMI 33.12 kg/m   Physical Exam  Constitutional: He appears well-developed and well-nourished.  Non-toxic appearance. He does not have a sickly appearance. He does not appear ill. No distress.  HENT:  Head: Normocephalic and atraumatic.  Right Ear: Tympanic membrane, external ear and ear canal normal.  Left Ear: Tympanic membrane, external ear and ear canal normal.  Nose: Nose normal. Right sinus exhibits no maxillary sinus tenderness  and no frontal sinus tenderness. Left sinus exhibits no maxillary sinus tenderness and no frontal sinus tenderness.  Mouth/Throat: Uvula is midline, oropharynx is clear and moist and mucous membranes are normal. He does not have dentures. No oral lesions. No trismus in the jaw. Abnormal dentition. Dental caries present. No dental abscesses, uvula swelling or lacerations. No oropharyngeal exudate, posterior oropharyngeal edema, posterior oropharyngeal erythema or tonsillar abscesses. No tonsillar exudate.    Multiple missing teeth overall poor dentition.  Tenderness to left gum around tooth #22 with slight gum erythema and edema.  No evidence of drainable abscess.  No trismus.  No pain or elevation under her tongue.  No facial swelling.  No drooling, handling secretions without difficulty.  Eyes: Pupils are equal, round, and reactive to light.  Neck: Normal range of motion. Neck supple.  Cardiovascular: Normal rate and regular rhythm.  Pulmonary/Chest: Effort normal. No respiratory distress.  Abdominal: Soft. He exhibits no distension.  Musculoskeletal: Normal range of motion.  Neurological: He is alert.  Skin: Skin is warm and dry. He is not diaphoretic.  Psychiatric: He has a normal mood and affect.  Nursing note and vitals reviewed.    ED Treatments / Results  Labs (all labs ordered are listed, but only abnormal results are displayed) Labs Reviewed - No data to display  EKG None  Radiology No results found.  Procedures Procedures (including critical care time)  Medications Ordered in ED Medications - No data to display   Initial Impression / Assessment and Plan / ED Course  I have reviewed the triage vital signs and the nursing notes.  Pertinent labs & imaging results that were available during my care of the patient were reviewed by me and considered in my medical decision making (see chart for details).  37 year old male who appears overall well presents for evaluation of  dental pain.  Patient is afebrile, nontoxic, nonseptic appearing.  Exam shows overall poor dentition with multiple missing teeth.  Tenderness to tooth #22 with mild surrounding gum erythema and edema.  Their is no obvious drainable abscess.  Has used clindamycin for this previously which has worked for his dental abscesses.  No facial swelling, trismus, elevation or pain under her tongue.  Low suspicion for PTA, Ludwig angina or spread of infection.  Will DC home on clindamycin as there is no drainable abscess at this time.  Discussed strict return precautions.  Discussed importance of following up with dentistry tomorrow.  Patient stable for discharge at this time.  Patient voiced understanding is agreeable for follow-up   Final Clinical Impressions(s) / ED Diagnoses   Final diagnoses:  Pain due to dental caries    ED Discharge Orders         Ordered    clindamycin (CLEOCIN) 150 MG capsule  3 times daily     11/09/17 1042           Alexander Mcauley A, PA-C 11/09/17 1052  Melene Plan, DO 11/09/17 1239

## 2017-11-09 NOTE — ED Triage Notes (Signed)
Pt. Stated, toothache, bad tooth started on Thursday

## 2018-03-30 ENCOUNTER — Encounter (HOSPITAL_COMMUNITY): Payer: Self-pay | Admitting: *Deleted

## 2018-03-30 ENCOUNTER — Emergency Department (HOSPITAL_COMMUNITY)
Admission: EM | Admit: 2018-03-30 | Discharge: 2018-03-30 | Disposition: A | Discharge status: Home / Self Care | Payer: Self-pay | Attending: Emergency Medicine | Admitting: Emergency Medicine

## 2018-03-30 ENCOUNTER — Other Ambulatory Visit: Payer: Self-pay

## 2018-03-30 DIAGNOSIS — Z79899 Other long term (current) drug therapy: Secondary | ICD-10-CM | POA: Insufficient documentation

## 2018-03-30 DIAGNOSIS — F17210 Nicotine dependence, cigarettes, uncomplicated: Secondary | ICD-10-CM | POA: Insufficient documentation

## 2018-03-30 DIAGNOSIS — K0889 Other specified disorders of teeth and supporting structures: Secondary | ICD-10-CM | POA: Insufficient documentation

## 2018-03-30 MED ORDER — NAPROXEN 500 MG PO TABS: 500.0000 mg | ORAL_TABLET | Freq: Two times a day (BID) | ORAL | 0 refills | Status: AC

## 2018-03-30 MED ORDER — AMOXICILLIN-POT CLAVULANATE 875-125 MG PO TABS: 1.0000 | ORAL_TABLET | Freq: Two times a day (BID) | ORAL | 0 refills | Status: DC

## 2018-03-30 MED ORDER — NAPROXEN 500 MG PO TABS: 500.0000 mg | ORAL_TABLET | Freq: Two times a day (BID) | ORAL | 0 refills | Status: DC

## 2018-03-30 MED ORDER — AMOXICILLIN-POT CLAVULANATE 875-125 MG PO TABS: 1.0000 | ORAL_TABLET | Freq: Two times a day (BID) | ORAL | 0 refills | Status: AC

## 2018-03-30 NOTE — ED Notes (Signed)
Pt reports L lower dental abscess x 2 days.  Dental carries noted.  He has had his R lower teeth pulled in the past.  He is requesting abx.

## 2018-03-30 NOTE — ED Provider Notes (Signed)
[facility]-EMERGENCY DEPT Provider Note   CSN: [id number] Arrival date & time: 03/30/18  1138    History   Chief Complaint Chief Complaint  Patient presents with  . dental abscess    [name] is a 38 y.o. male with a history of tobacco abuse who presents to the emergency department with complaints of left lower dental pain that is been progressively worsening over the past 2 days.  Pain is a 7 out of 10 in severity, no alleviating or aggravating factors, has tried Advil without much change.  Notes that this morning he seemed to have some associated facial swelling.  Denies fever, chills, nausea, vomiting, drooling, voice change, sore throat, trouble swallowing, dyspnea, or swelling beneath the tongue.  He does not currently have a dentist.     HPI  History reviewed. No pertinent past medical history.  Patient Active Problem List   Diagnosis Date Noted  . Empyema (HCC) 06/13/2017  . Right lower lobe pneumonia (HCC) 06/11/2017    Past Surgical History:  Procedure Laterality Date  . HAND SURGERY    . THORACOTOMY Right 06/13/2017   Procedure: THORACOTOMY MAJOR;  Surgeon: Alleen Borne, MD;  Location: [facility] OR;  Service: Thoracic;  Laterality: Right;  Marland Kitchen VIDEO ASSISTED THORACOSCOPY (VATS)/EMPYEMA Right 06/13/2017   Procedure: VIDEO ASSISTED THORACOSCOPY (VATS)/DRAIN EMPYEMA;  Surgeon: Alleen Borne, MD;  Location: [facility] OR;  Service: Thoracic;  Laterality: Right;        Home Medications    Prior to Admission medications   Medication Sig Start Date End Date Taking? Authorizing Provider  chlorhexidine (PERIDEX) 0.12 % solution Use as directed 15 mLs in the mouth or throat 2 (two) times daily. 08/09/17   Harlene Salts A, PA-C  ibuprofen (ADVIL,MOTRIN) 200 MG tablet Take 400 mg by mouth every 6 (six) hours as needed for moderate pain.    [provider]  nicotine (NICODERM CQ - DOSED IN MG/24 HR) 7 mg/24hr patch Place 7 mg onto the skin  daily.    [provider]    Family History Family History  Problem Relation Age of Onset  . Diabetes Mother   . Diabetes Father     Social History Social History   Tobacco Use  . Smoking status: Current Every Day Smoker    Packs/day: 1.00    Years: 20.00    Pack years: 20.00    Types: Cigarettes  . Smokeless tobacco: Never Used  Substance Use Topics  . Alcohol use: No  . Drug use: No     Allergies   Patient has no known allergies.   Review of Systems Review of Systems  Constitutional: Negative for chills and fever.  HENT: Positive for dental problem and facial swelling. Negative for sore throat, trouble swallowing and voice change.   Respiratory: Negative for shortness of breath.   Gastrointestinal: Negative for nausea and vomiting.  Musculoskeletal: Negative for neck pain and neck stiffness.     Physical Exam Updated Vital Signs BP (!) 138/92 (BP Location: Left Arm)   Pulse 72   Temp 98.1 F (36.7 C) (Oral)   Resp 16   Ht 6\' 3"  (1.905 m)   Wt 116.1 kg   SpO2 99%   BMI 32.00 kg/m   Physical Exam Vitals signs and nursing note reviewed.  Constitutional:      General: He is not in acute distress.    Appearance: He is well-developed. He is not toxic-appearing.  HENT:  Head: Normocephalic and atraumatic.     Comments: No appreciable facial swelling.  No erythema/rashes noted to the skin.  No external palpable fluctuance or induration.    Right Ear: Tympanic membrane is not perforated, erythematous, retracted or bulging.     Left Ear: Tympanic membrane is not perforated, erythematous, retracted or bulging.     Nose: Nose normal.     Mouth/Throat:     Pharynx: Uvula midline. No oropharyngeal exudate or posterior oropharyngeal erythema.      Comments: Posterior oropharynx is symmetric appearing. Patient tolerating own secretions without difficulty. No trismus. No drooling. No hot potato voice. No swelling beneath the tongue, submandibular  compartment is soft.  Eyes:     General:        Right eye: No discharge.        Left eye: No discharge.     Conjunctiva/sclera: Conjunctivae normal.  Neck:     Musculoskeletal: Normal range of motion and neck supple. No neck rigidity or muscular tenderness.  Lymphadenopathy:     Cervical: No cervical adenopathy.  Neurological:     Mental Status: He is alert.  Psychiatric:        Behavior: Behavior normal.        Thought Content: Thought content normal.      ED Treatments / Results  Labs (all labs ordered are listed, but only abnormal results are displayed) Labs Reviewed - No data to display  EKG None  Radiology No results found.  Procedures Procedures (including critical care time)  Medications Ordered in ED Medications - No data to display   Initial Impression / Assessment and Plan / ED Course  I have reviewed the triage vital signs and the nursing notes.  Pertinent labs & imaging results that were available during my care of the patient were reviewed by me and considered in my medical decision making (see chart for details).    Patient presents with dental pain. Patient is nontoxic appearing, vitals without significant abnormality- BP elevated, doubt HTN emergency, PCP recheck. No gross abscess.  Exam unconcerning for Ludwig's angina or spread of infection at this time. Will treat with Augmentin and Naproxen.  Urged patient to follow-up with dentist, dental resources were provided.  Discussed treatment plan and need for follow up as well as return precautions. Provided opportunity for questions, patient confirmed understanding and is agreeable to plan.   Final Clinical Impressions(s) / ED Diagnoses   Final diagnoses:  Pain, dental    ED Discharge Orders         Ordered    amoxicillin-clavulanate (AUGMENTIN) 875-125 MG tablet  Every 12 hours,   Status:  Discontinued     03/30/18 1230    naproxen (NAPROSYN) 500 MG tablet  2 times daily,   Status:  Discontinued      03/30/18 1230    amoxicillin-clavulanate (AUGMENTIN) 875-125 MG tablet  Every 12 hours     03/30/18 1231    naproxen (NAPROSYN) 500 MG tablet  2 times daily     03/30/18 [address] [place], PA-C 03/30/18 1232    Gerhard Munch, MD 04/01/18 [phone]

## 2018-03-30 NOTE — Discharge Instructions (Signed)
Call one of the dentists offices provided to schedule an appointment for re-evaluation and further management within the next 48 hours.   I have prescribed you Augmentin which is an antibiotic to treat the infection and Naproxen which is an anti-inflammatory medicine to treat the pain.   Please take all of your antibiotics until finished. You may develop abdominal discomfort or diarrhea from the antibiotic.  You may help offset this with probiotics which you can buy at the store (ask your pharmacist if unable to find) or get probiotics in the form of eating yogurt. Do not eat or take the probiotics until 2 hours after your antibiotic. If you are unable to tolerate these side effects follow-up with your primary care provider or return to the emergency department.   If you begin to experience any blistering, rashes, swelling, or difficulty breathing seek medical care for evaluation of potentially more serious side effects.   Be sure to eat something when taking the Naproxen as it can cause stomach upset and at worst stomach bleeding. Do not take additional non steroidal anti-inflammatory medicines such as Ibuprofen, Aleve, Advil, Mobic, Diclofenac, or goodie powder while taking Naproxen. You may supplement with Tylenol.   We have prescribed you new medication(s) today. Discuss the medications prescribed today with your pharmacist as they can have adverse effects and interactions with your other medicines including over the counter and prescribed medications. Seek medical evaluation if you start to experience new or abnormal symptoms after taking one of these medicines, seek care immediately if you start to experience difficulty breathing, feeling of your throat closing, facial swelling, or rash as these could be indications of a more serious allergic reaction  If you start to experience and new or worsening symptoms return to the emergency department. If you start to experience fever, chills, neck  stiffness/pain, or inability to move your neck or open your mouth come back to the emergency department immediately.   Please also follow-up with primary care within 1 week for recheck of your blood pressure as it was elevated in the emergency department today. Vitals:   03/30/18 1200  BP: (!) 138/92  Pulse: 72  Resp: 16  Temp: 98.1 F (36.7 C)  SpO2: 99%

## 2018-04-06 ENCOUNTER — Other Ambulatory Visit: Payer: Self-pay

## 2018-04-06 ENCOUNTER — Emergency Department (HOSPITAL_COMMUNITY)
Admission: EM | Admit: 2018-04-06 | Discharge: 2018-04-06 | Disposition: A | Discharge status: Home / Self Care | Payer: Self-pay | Attending: Emergency Medicine | Admitting: Emergency Medicine

## 2018-04-06 DIAGNOSIS — Y999 Unspecified external cause status: Secondary | ICD-10-CM | POA: Insufficient documentation

## 2018-04-06 DIAGNOSIS — X58XXXA Exposure to other specified factors, initial encounter: Secondary | ICD-10-CM | POA: Insufficient documentation

## 2018-04-06 DIAGNOSIS — T161XXA Foreign body in right ear, initial encounter: Secondary | ICD-10-CM | POA: Insufficient documentation

## 2018-04-06 DIAGNOSIS — Z5321 Procedure and treatment not carried out due to patient leaving prior to being seen by health care provider: Secondary | ICD-10-CM | POA: Insufficient documentation

## 2018-04-06 DIAGNOSIS — Y929 Unspecified place or not applicable: Secondary | ICD-10-CM | POA: Insufficient documentation

## 2018-04-06 DIAGNOSIS — Y939 Activity, unspecified: Secondary | ICD-10-CM | POA: Insufficient documentation

## 2018-04-06 NOTE — ED Triage Notes (Signed)
Patient arrived by self from home. Pt c/o RT ear pain. Pt stated he thinks a bug crawled in his ear.

## 2018-05-31 ENCOUNTER — Encounter (HOSPITAL_COMMUNITY): Payer: Self-pay

## 2018-05-31 ENCOUNTER — Other Ambulatory Visit: Payer: Self-pay

## 2018-05-31 ENCOUNTER — Emergency Department (HOSPITAL_COMMUNITY)
Admission: EM | Admit: 2018-05-31 | Discharge: 2018-05-31 | Disposition: A | Discharge status: Home / Self Care | Payer: Self-pay | Attending: Emergency Medicine | Admitting: Emergency Medicine

## 2018-05-31 DIAGNOSIS — Z79899 Other long term (current) drug therapy: Secondary | ICD-10-CM | POA: Insufficient documentation

## 2018-05-31 DIAGNOSIS — K029 Dental caries, unspecified: Secondary | ICD-10-CM | POA: Insufficient documentation

## 2018-05-31 DIAGNOSIS — K0889 Other specified disorders of teeth and supporting structures: Secondary | ICD-10-CM

## 2018-05-31 DIAGNOSIS — K0381 Cracked tooth: Secondary | ICD-10-CM | POA: Insufficient documentation

## 2018-05-31 DIAGNOSIS — I10 Essential (primary) hypertension: Secondary | ICD-10-CM | POA: Insufficient documentation

## 2018-05-31 DIAGNOSIS — F17210 Nicotine dependence, cigarettes, uncomplicated: Secondary | ICD-10-CM | POA: Insufficient documentation

## 2018-05-31 DIAGNOSIS — K047 Periapical abscess without sinus: Secondary | ICD-10-CM | POA: Insufficient documentation

## 2018-05-31 MED ORDER — AMOXICILLIN 500 MG PO CAPS: 500.0000 mg | ORAL_CAPSULE | Freq: Three times a day (TID) | ORAL | 0 refills | Status: AC

## 2018-05-31 NOTE — ED Triage Notes (Addendum)
Patient c/o dental pain since Friday.   Patient reports abscess on right side of gums.  Poor dental care noted by this RN.     A/Ox4 Ambulatory to room.   Denies medical Hx. Or allergies.

## 2018-05-31 NOTE — ED Provider Notes (Signed)
[facility]-EMERGENCY DEPT Provider Note   CSN: [id number] Arrival date & time: 05/31/18  1702    History   Chief Complaint Chief Complaint  Patient presents with  . Dental Pain    HPI Lemar LivingsKevin L Neidlinger is a 38 y.o. male with a past medical history of multiple visits for dental pain, presents today for evaluation of pain since Friday on his right anterior lower gumline.  He reports that his pain started Friday however he thought yesterday it was getting better until today when he woke up and he had a swelling there that he feels like is a pus pocket similar to what he has had in the past.  His last tetanus shot was 3 days ago.  He denies any voice change, difficulty swallowing, stiffness opening his mouth, fevers or any other complaints or concerns.     HPI  History reviewed. No pertinent past medical history.  Patient Active Problem List   Diagnosis Date Noted  . Empyema (HCC) 06/13/2017  . Right lower lobe pneumonia (HCC) 06/11/2017    Past Surgical History:  Procedure Laterality Date  . HAND SURGERY    . THORACOTOMY Right 06/13/2017   Procedure: THORACOTOMY MAJOR;  Surgeon: Alleen BorneBartle, Bryan K, MD;  Location: [facility] OR;  Service: Thoracic;  Laterality: Right;  Marland Kitchen. VIDEO ASSISTED THORACOSCOPY (VATS)/EMPYEMA Right 06/13/2017   Procedure: VIDEO ASSISTED THORACOSCOPY (VATS)/DRAIN EMPYEMA;  Surgeon: Alleen BorneBartle, Bryan K, MD;  Location: [facility] OR;  Service: Thoracic;  Laterality: Right;        Home Medications    Prior to Admission medications   Medication Sig Start Date End Date Taking? Authorizing Provider  amoxicillin (AMOXIL) 500 MG capsule Take 1 capsule (500 mg total) by mouth 3 (three) times daily. 05/31/18   Cristina GongHammond, Samuel Rittenhouse W, PA-C  amoxicillin-clavulanate (AUGMENTIN) 875-125 MG tablet Take 1 tablet by mouth every 12 (twelve) hours. 03/30/18   Petrucelli, Samantha R, PA-C  chlorhexidine (PERIDEX) 0.12 % solution Use as directed 15 mLs in the mouth or throat 2 (two)  times daily. 08/09/17   Harlene SaltsMorelli, Brandon A, PA-C  ibuprofen (ADVIL,MOTRIN) 200 MG tablet Take 400 mg by mouth every 6 (six) hours as needed for moderate pain.    [provider]  naproxen (NAPROSYN) 500 MG tablet Take 1 tablet (500 mg total) by mouth 2 (two) times daily. 03/30/18   Petrucelli, Samantha R, PA-C  nicotine (NICODERM CQ - DOSED IN MG/24 HR) 7 mg/24hr patch Place 7 mg onto the skin daily.    [provider]    Family History Family History  Problem Relation Age of Onset  . Diabetes Mother   . Diabetes Father     Social History Social History   Tobacco Use  . Smoking status: Current Every Day Smoker    Packs/day: 1.00    Years: 20.00    Pack years: 20.00    Types: Cigarettes  . Smokeless tobacco: Never Used  Substance Use Topics  . Alcohol use: No  . Drug use: No     Allergies   Patient has no known allergies.   Review of Systems Review of Systems  Constitutional: Negative for chills, fatigue and fever.  HENT: Positive for dental problem. Negative for drooling, trouble swallowing and voice change.   All other systems reviewed and are negative.    Physical Exam Updated Vital Signs BP (!) 150/97 (BP Location: Left Arm)   Pulse 91   Temp 98.5 F (36.9 C) (Oral)   Resp 18  SpO2 93%   Physical Exam Vitals signs and nursing note reviewed.  Constitutional:      General: He is not in acute distress. HENT:     Head: Normocephalic and atraumatic.     Mouth/Throat:     Dentition: Abnormal dentition.     Pharynx: Oropharynx is clear. Uvula midline.     Comments: Very poor dentition.  There is a localized swelling consistent with a dental abscess in front of the right mandibular lateral incisor.  This tooth is broken.  There is no edema in the oropharynx, no swelling under the jaw or concern for spread of infection. Eyes:     Conjunctiva/sclera: Conjunctivae normal.  Neck:     Musculoskeletal: Normal range of motion. No neck rigidity.   Pulmonary:     Effort: Pulmonary effort is normal. No respiratory distress.  Neurological:     General: No focal deficit present.     Mental Status: He is alert.  Psychiatric:        Mood and Affect: Mood normal.      ED Treatments / Results  Labs (all labs ordered are listed, but only abnormal results are displayed) Labs Reviewed - No data to display  EKG None  Radiology No results found.  Procedures .Marland KitchenIncision and Drainage Date/Time: 05/31/2018 5:38 PM Performed by: Cristina Gong, PA-C Authorized by: Cristina Gong, PA-C   Consent:    Consent obtained:  Verbal   Consent given by:  Patient   Risks discussed:  Damage to other organs, infection, incomplete drainage, pain and bleeding Location:    Type:  Abscess   Size:  0.5cm   Location:  Mouth   Mouth location:  Alveolar process Anesthesia (see MAR for exact dosages):    Anesthesia method:  Topical application   Topical anesthetic:  Benzocaine gel Procedure type:    Complexity:  Simple Procedure details:    Incision types:  Stab incision   Scalpel blade:  11   Drainage:  Bloody and purulent   Drainage amount:  Scant   Wound treatment:  Wound left open Post-procedure details:    Patient tolerance of procedure:  Tolerated well, no immediate complications   (including critical care time)  Medications Ordered in ED Medications - No data to display   Initial Impression / Assessment and Plan / ED Course  I have reviewed the triage vital signs and the nursing notes.  Pertinent labs & imaging results that were available during my care of the patient were reviewed by me and considered in my medical decision making (see chart for details).  Clinical Course as of May 30 1798  Sun May 31, 2018  1726 Benzocaine/lollicaine placed   [EH]    Clinical Course User Index [EH] Cristina Gong, PA-C      Patient presents today for evaluation of a dental abscess.  He has a obvious intraoral swelling  below his right mandibular lateral incisor.  His teeth are in a very poor state with multiple broken and carious teeth.  Given that his pain started before the abscess will treat with antibiotics in addition to I&D.  I&D was performed, please see procedure note.  He is afebrile, not tachycardic or tachypneic.  Airway is intact he is not drooling.  He is given information for dentist on call along with appropriate instructions.  Will treat with amoxicillin.  Tetanus is up-to-date.  Is also informed that his blood pressure is elevated and that he needs to obtain follow-up.  The information for wellness clinic.  OTC medicine for pain as needed.  Return precautions were discussed with patient who states their understanding.  At the time of discharge patient denied any unaddressed complaints or concerns.  Patient is agreeable for discharge home.   Final Clinical Impressions(s) / ED Diagnoses   Final diagnoses:  Pain, dental  Dental abscess  Hypertension, unspecified type    ED Discharge Orders         Ordered    amoxicillin (AMOXIL) 500 MG capsule  3 times daily     05/31/18 1756           Norman Clay 05/31/18 Flossie Buffy    Gerhard Munch, MD 06/04/18 [phone]

## 2018-05-31 NOTE — Discharge Instructions (Signed)
Please take Ibuprofen (Advil, motrin) and Tylenol (acetaminophen) to relieve your pain.  You may take up to 600 MG (3 pills) of normal strength ibuprofen every 8 hours as needed.  In between doses of ibuprofen you make take tylenol, up to 1,000 mg (two extra strength pills).  Do not take more than 3,000 mg tylenol in a 24 hour period.  Please check all medication labels as many medications such as pain and cold medications may contain tylenol.  Do not drink alcohol while taking these medications.  Do not take other NSAID'S while taking ibuprofen (such as aleve or naproxen).  Please take ibuprofen with food to decrease stomach upset.  You may have diarrhea from the antibiotics.  It is very important that you continue to take the antibiotics even if you get diarrhea unless a medical professional tells you that you may stop taking them.  If you stop too early the bacteria you are being treated for will become stronger and you may need different, more powerful antibiotics that have more side effects and worsening diarrhea.  Please stay well hydrated and consider probiotics as they may decrease the severity of your diarrhea.   While in the ED your blood pressure was high.  Please follow up with your primary care doctor or the wellness clinic for repeat evaluation as you may need medication.  High blood pressure can cause long term, potentially serious, damage if left untreated.

## 2020-01-22 IMAGING — CT CT CHEST W/ CM
2 of 4 series · 15 of 36 positions shown, 18 images · IV contrast (ISOVUE)
Comparison: 05/31/2017.

CLINICAL DATA: Shortness of breath, right lateral chest pain for 1
month, and fever.

EXAM:
CT CHEST WITH CONTRAST
TECHNIQUE: Multidetector CT imaging of the chest was performed during
intravenous contrast administration.
CONTRAST:  75mL BGY1LQ-KSS IOPAMIDOL (BGY1LQ-KSS) INJECTION 61%

[Series 2: axial st · axial · 0.85mm/px · z∈[-338,-60]mm · 12 of 163 slices shown, 15 images]
[im 12/163  mediastinal]
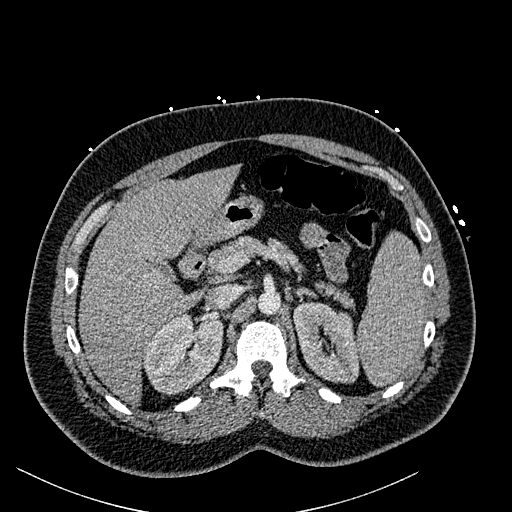
[im 12/163  lung]
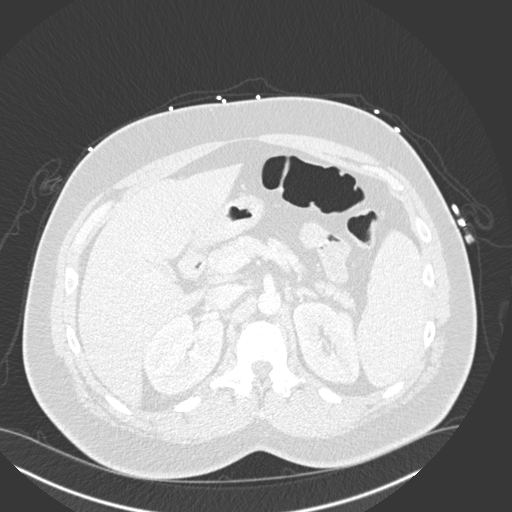
[im 24/163  lung]
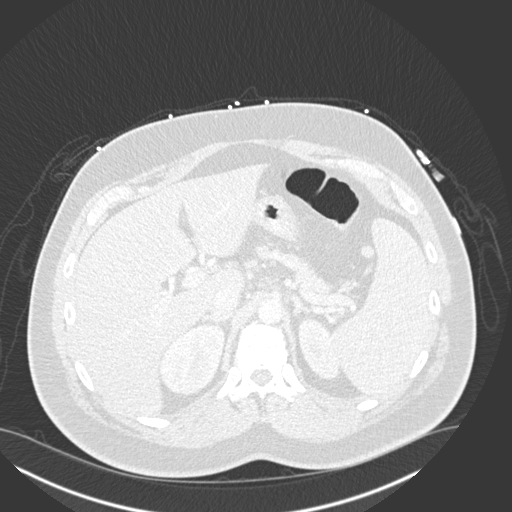
[im 35/163  lung]
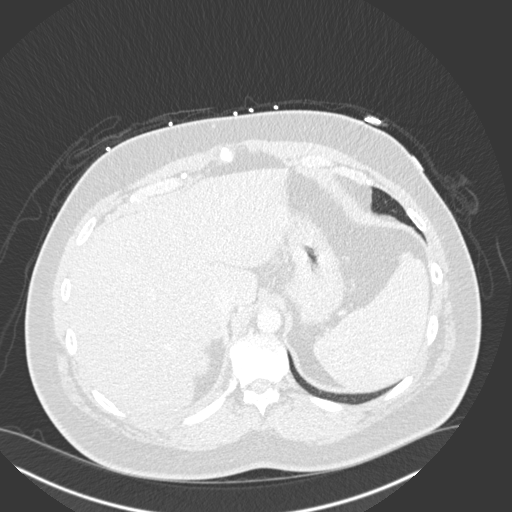
[im 47/163  lung]
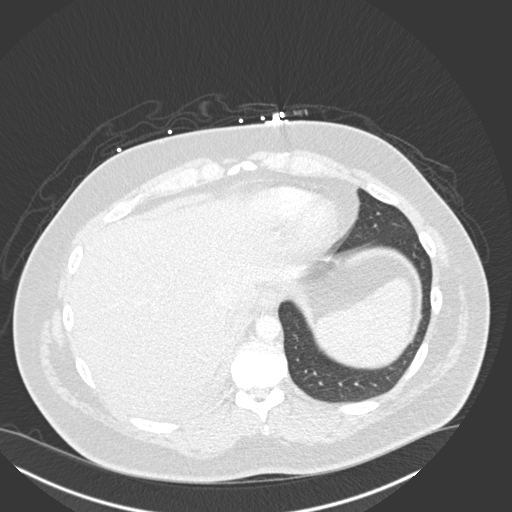
[im 58/163  mediastinal]
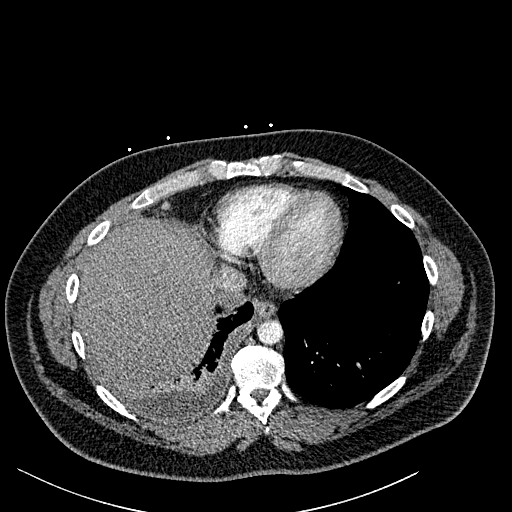
[im 58/163  lung]
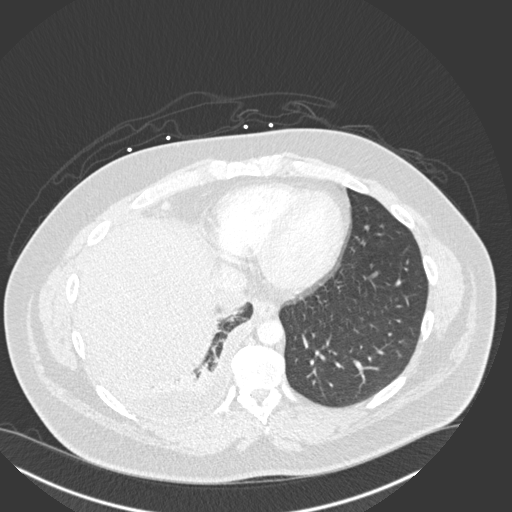
[im 70/163  lung]
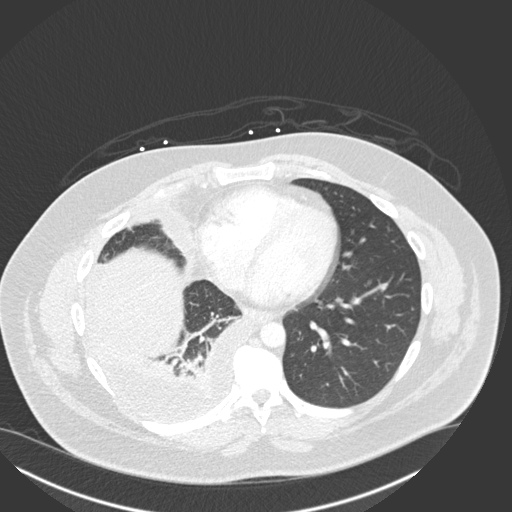
[im 93/163  lung]
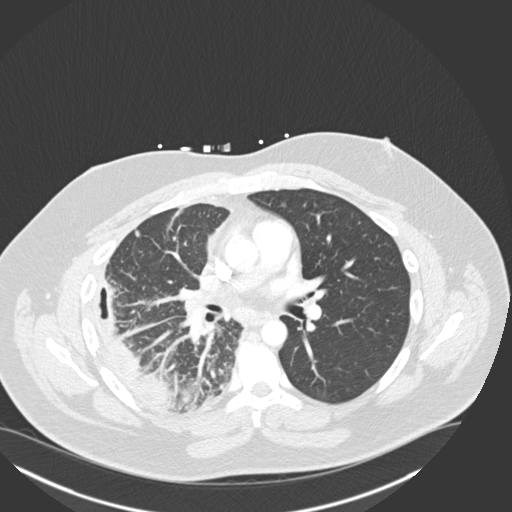
[im 105/163  lung]
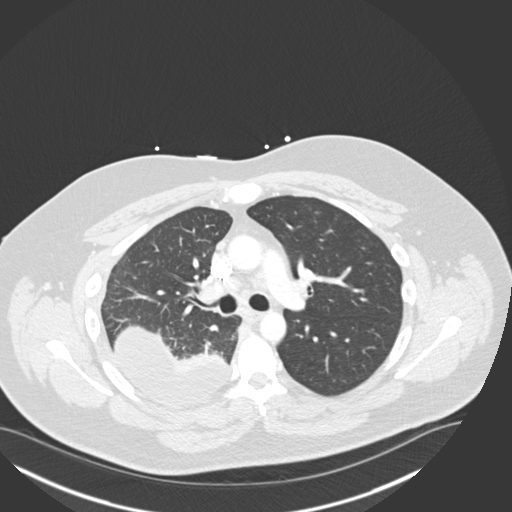
[im 116/163  mediastinal]
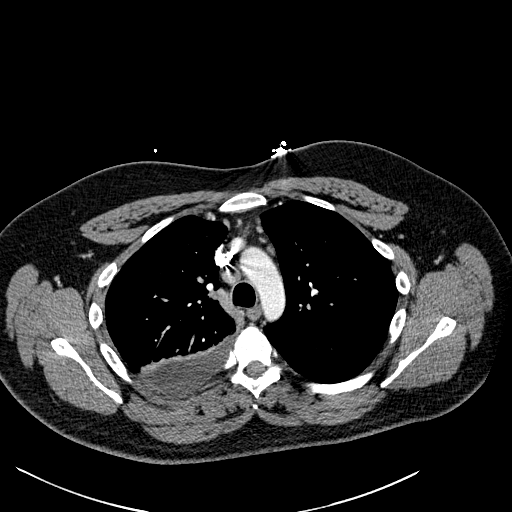
[im 116/163  lung]
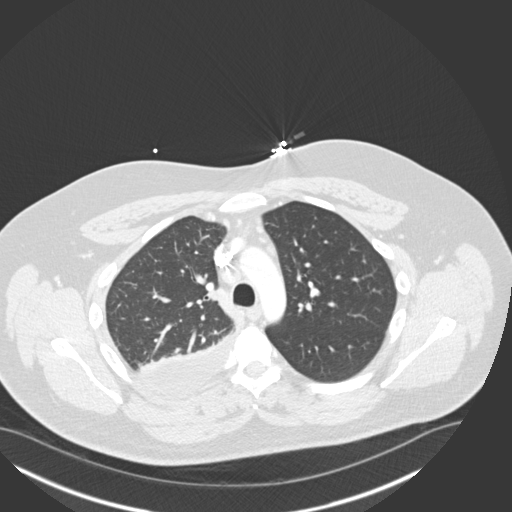
[im 128/163  lung]
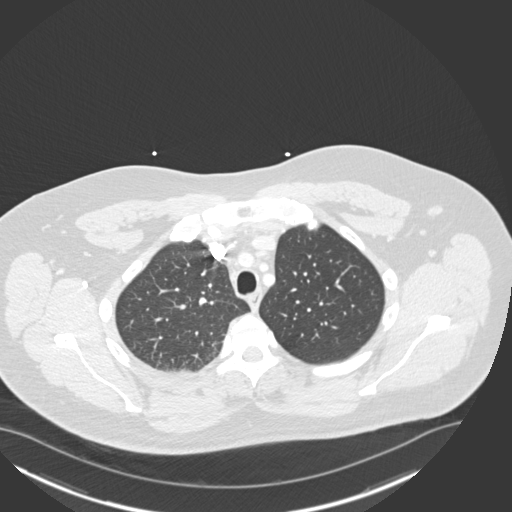
[im 139/163  lung]
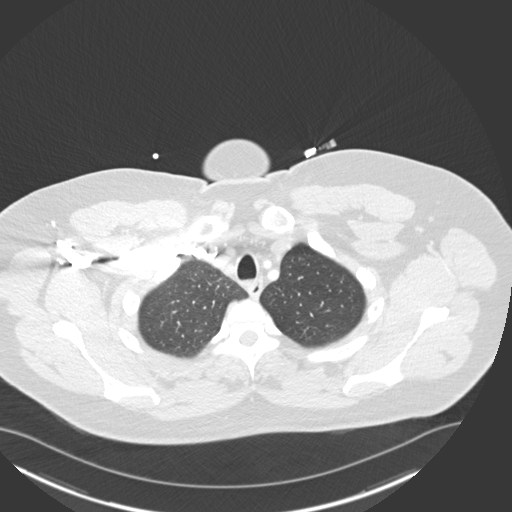
[im 151/163  lung]
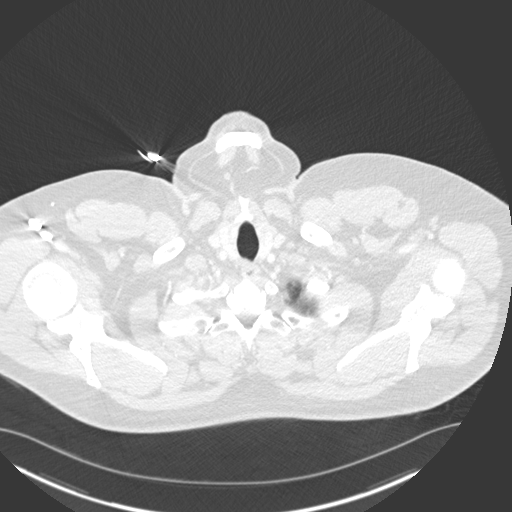

[Series 5: coronal · coronal · 0.64mm/px · 3 of 158 slices shown]
[im 32/158  lung]
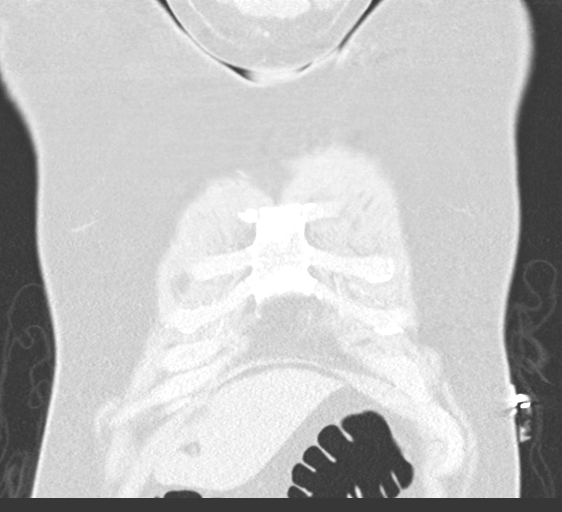
[im 63/158  lung]
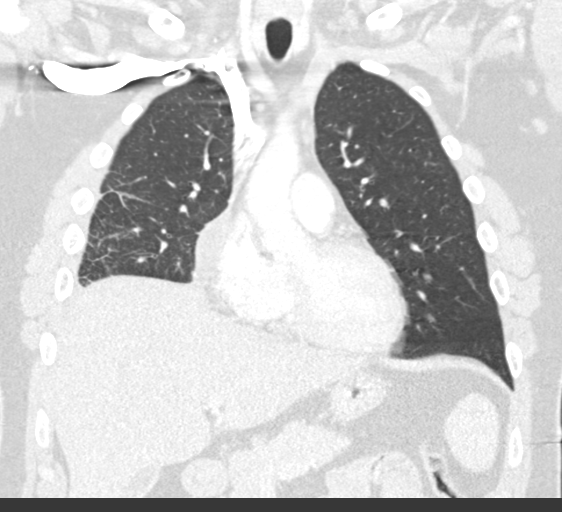
[im 95/158  lung]
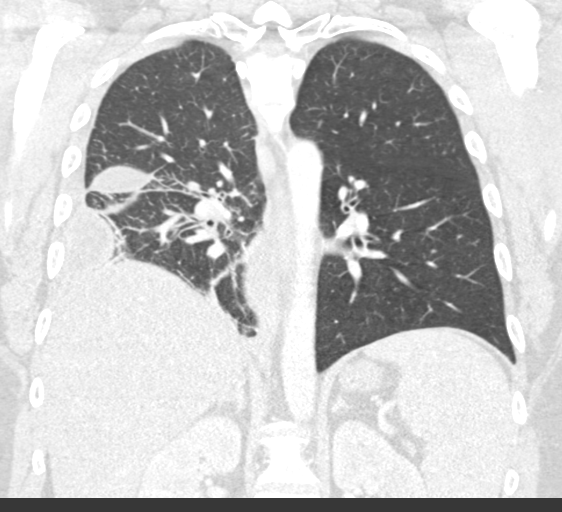

[15 of 36 positions shown; findings below may reference images not displayed]

FINDINGS: Cardiovascular: Vascular structures are unremarkable. Heart size
normal. No pericardial effusion.

Mediastinum/Nodes: Mediastinal lymph nodes measure up to 1.6 cm in
the low right paratracheal station, previously 9 mm. Subcarinal
lymph node measures 1.7 cm, previously 12 mm. Right hilar adenopathy
measures up to 1.6 cm, previously 11 mm. No axillary adenopathy.
Esophagus is grossly unremarkable.

Lungs/Pleura: Septal thickening, peribronchovascular
nodularity/consolidation and volume loss in the right middle and
right lower lobes are largely new from 05/31/2017. In addition,
there are sizable loculated collections of pleural fluid and air in
the right hemithorax, also largely new from 05/31/2017. Probable
subpleural lymph nodes along the minor fissure. Left lung is clear.
Debris is seen dependently in the trachea.

Upper Abdomen: Liver may be slightly decreased in attenuation
diffusely. Visualized portions of the liver, gallbladder, adrenal
glands, kidneys, spleen, pancreas stomach and bowel are otherwise
grossly unremarkable. There are several upper abdominal lymph nodes
which measure up to 10 mm in the gastrohepatic ligament.

Musculoskeletal: Degenerative changes in the spine.
IMPRESSION: 1. Septal thickening, peribronchovascular nodularity/consolidation
and loculated collections of pleural fluid/air in the right
hemithorax, largely new from 05/31/2017 and most indicative of
bronchopneumonia with empyema. Difficult to definitively exclude a
bronchopleural fistula.
2. New mediastinal/subcarinal and right hilar adenopathy, likely
reactive.
3. Liver may be steatotic.

## 2020-01-22 IMAGING — CR DG CHEST 2V
2 series · 2 of 2 positions shown · non-contrast
Comparison: 05/31/2017

CLINICAL DATA: Right lower chest pain, shortness of breath, cough

EXAM:
CHEST - 2 VIEW

[w chest pa]
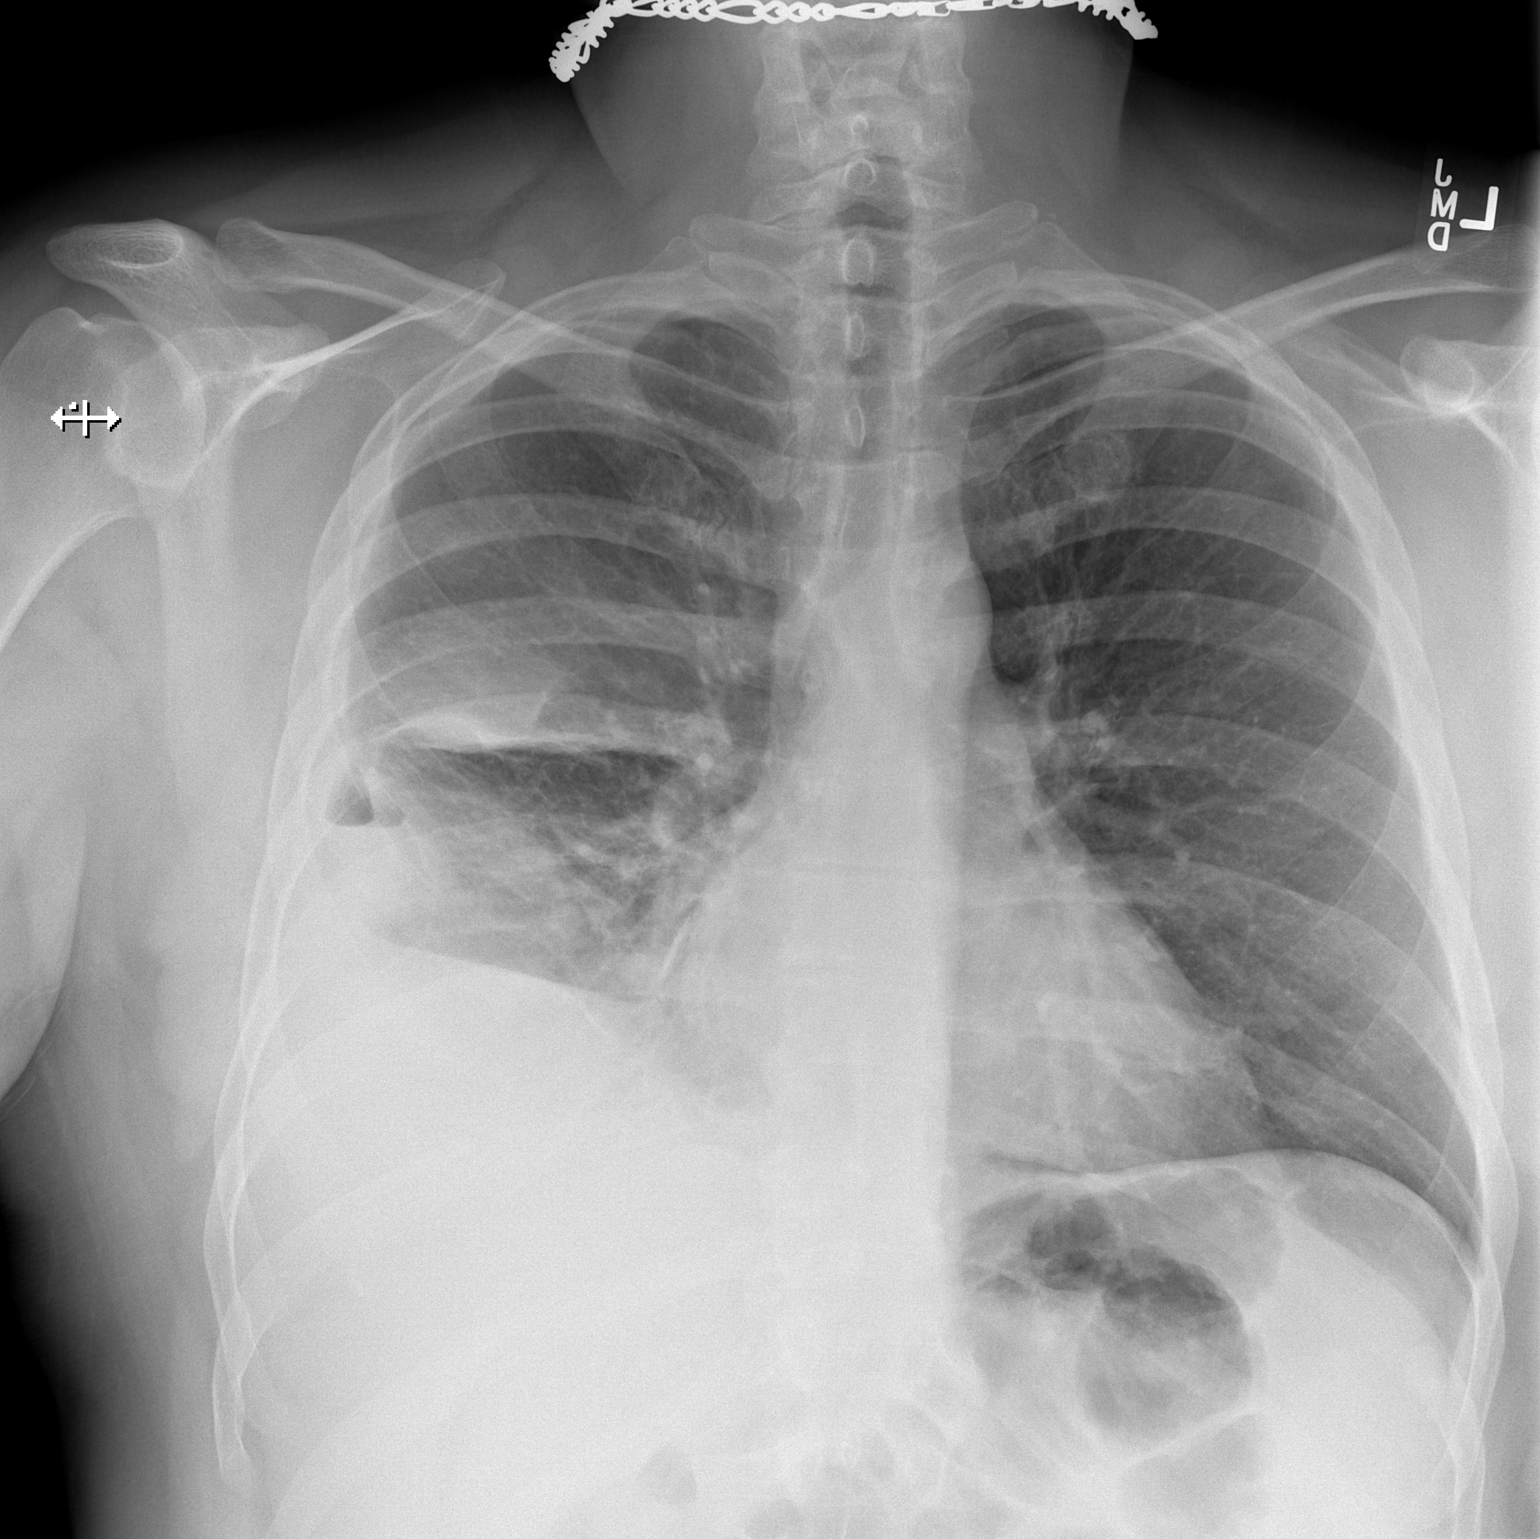

[w chest lat]
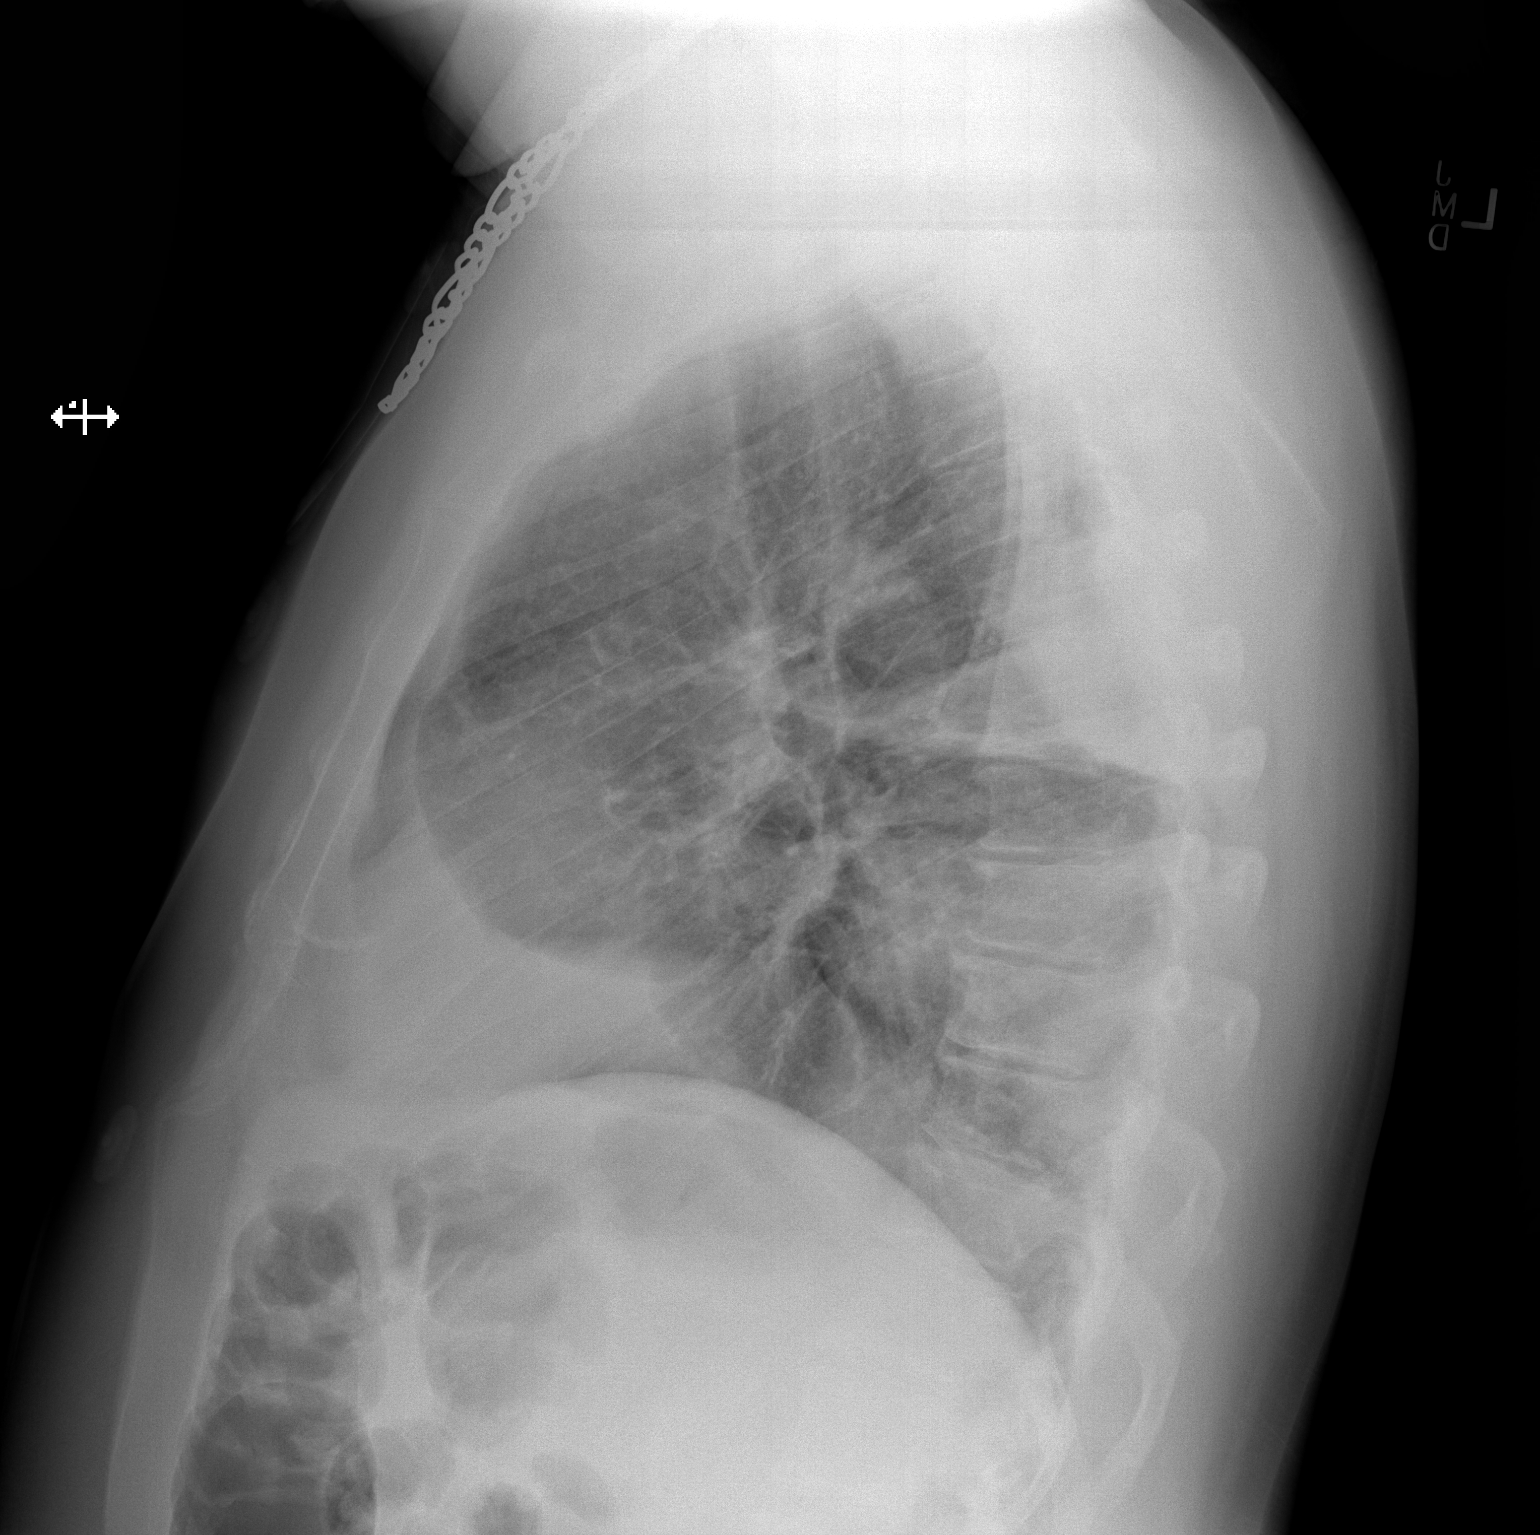

[2 of 2 positions shown; findings below may reference images not displayed]

FINDINGS: There is airspace consolidation noted in the inferior right upper
lobe and right lower lobe, worsening since prior study. Air-fluid
level is noted peripherally in the right lower hemithorax which
could reflect abscess or bronchopleural fistula. Left lung clear.
Heart is normal size. No acute bony abnormality.
IMPRESSION: Worsening airspace consolidation in the right lower lobe and
inferior right upper lobe concerning for pneumonia. Short air-fluid
level noted peripherally at the right lung base could be parenchymal
or pleural and represent abscess or bronchopleural fistula.
Recommend further evaluation with chest CT with IV contrast.

## 2020-01-24 IMAGING — DX DG CHEST 1V PORT
1 series · 1 of 1 positions shown · non-contrast
Comparison: Chest radiographs and chest CT obtained yesterday.

CLINICAL DATA: Status post right thoracotomy.

EXAM:
PORTABLE CHEST 1 VIEW

[chest ap]
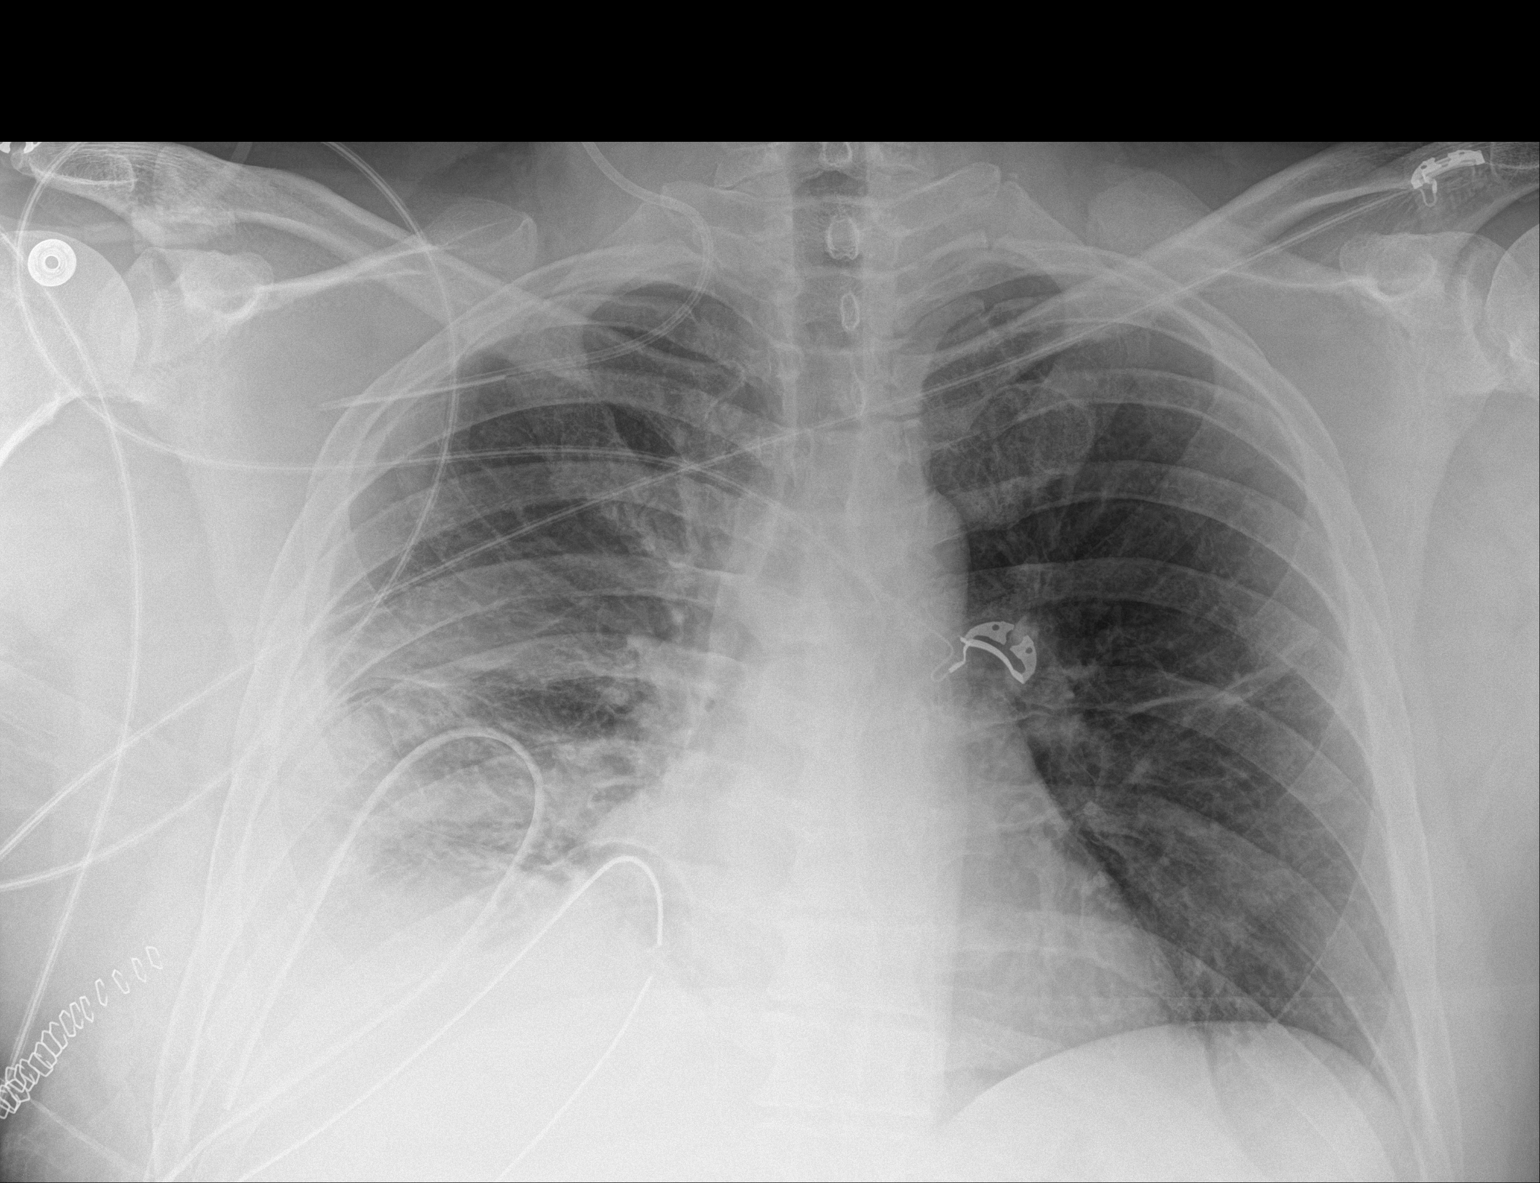

[1 of 1 positions shown; findings below may reference images not displayed]

FINDINGS: Normal sized heart. Chest tubes overlying the right lower chest and
upper abdomen with decreased right pleural fluid. There is some
persistent patchy and ill-defined density in the right lower lung
zone. Interval small amount of linear density in the left mid lung
zone. Interval right jugular catheter with its tip overlying the
upper right axilla. Decreased inspiration. Grossly normal sized
heart. Unremarkable bones.
IMPRESSION: 1. Malpositioned right jugular catheter with its tip in the right
axillary vein.
2. Right basilar chest tubes with decreased pleural fluid and no
pneumothorax.
3. Residual probable pneumonia at the right lung base.
4. Interval linear atelectasis on the left.

These results will be called to the ordering clinician or
representative by the Radiologist Assistant, and communication
documented in the PACS or zVision Dashboard.

## 2020-01-25 IMAGING — DX DG CHEST 1V PORT
1 series · 1 of 1 positions shown · non-contrast
Comparison: 06/13/2017 and older exams.

CLINICAL DATA: Followup.  Status post right thoracotomy.

EXAM:
PORTABLE CHEST 1 VIEW

[chest ap]
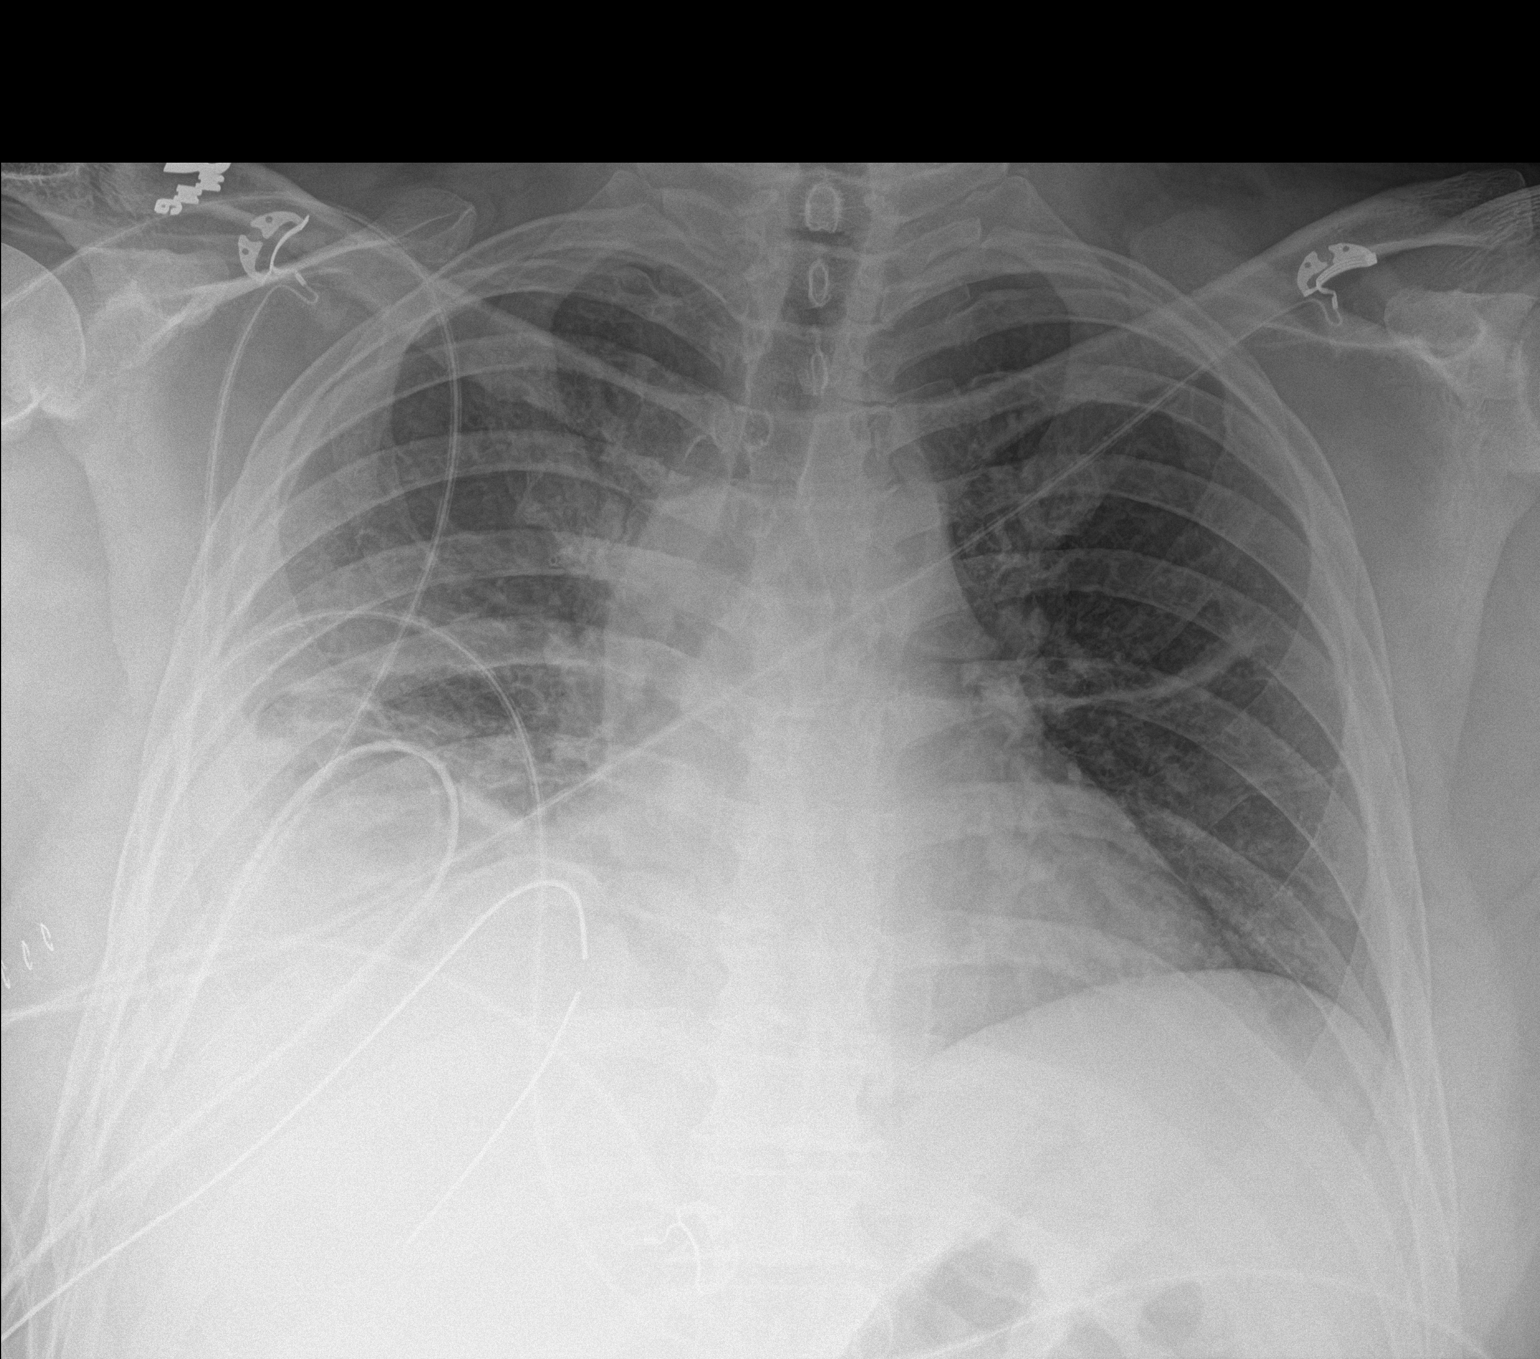

[1 of 1 positions shown; findings below may reference images not displayed]

FINDINGS: Stable right lower hemithorax chest tubes. There is stable
persistent right lung base opacity. Right upper lung is clear. No
pneumothorax.

Linear atelectasis extends laterally from the left hilum, stable.
Left lung otherwise clear.

The previously described malpositioned right internal jugular
central venous line has been removed.

Cardiac silhouette is normal size.
IMPRESSION: 1. No change in right inferior hemithorax chest tubes or right lung
base opacity when compared the prior exam.
2. No new lung abnormalities.
3. No pneumothorax.

## 2022-11-19 ENCOUNTER — Other Ambulatory Visit: Payer: Self-pay

## 2022-11-19 ENCOUNTER — Encounter (HOSPITAL_COMMUNITY): Payer: Self-pay | Admitting: Emergency Medicine

## 2022-11-19 ENCOUNTER — Emergency Department (HOSPITAL_COMMUNITY)
Admission: EM | Admit: 2022-11-19 | Discharge: 2022-11-19 | Discharge status: Against Medical Advice | Payer: Self-pay | Attending: Emergency Medicine | Admitting: Emergency Medicine

## 2022-11-19 DIAGNOSIS — M545 Low back pain, unspecified: Secondary | ICD-10-CM | POA: Insufficient documentation

## 2022-11-19 DIAGNOSIS — Z5321 Procedure and treatment not carried out due to patient leaving prior to being seen by health care provider: Secondary | ICD-10-CM | POA: Insufficient documentation

## 2022-11-19 NOTE — ED Triage Notes (Signed)
Patient arrives ambulatory by POV c/o left lower back radiating into left leg pain since last Tuesday. Denies any injury. 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
